# Patient Record
Sex: Male | Born: 1940 | Race: Black or African American | Hispanic: No | Smoking: Former smoker
Health system: Southern US, Community
[De-identification: ages and names within clinical notes are randomized; demographics above are authoritative.]

## PROBLEM LIST (undated history)

## (undated) DIAGNOSIS — F319 Bipolar disorder, unspecified: Secondary | ICD-10-CM

## (undated) DIAGNOSIS — F0390 Unspecified dementia without behavioral disturbance: Secondary | ICD-10-CM

## (undated) DIAGNOSIS — I10 Essential (primary) hypertension: Secondary | ICD-10-CM

## (undated) DIAGNOSIS — N400 Enlarged prostate without lower urinary tract symptoms: Secondary | ICD-10-CM

## (undated) HISTORY — PX: CATARACT EXTRACTION: SUR2

---

## 2014-09-26 ENCOUNTER — Emergency Department (HOSPITAL_COMMUNITY): Payer: Medicare Other

## 2014-09-26 ENCOUNTER — Emergency Department (HOSPITAL_COMMUNITY)
Admission: EM | Admit: 2014-09-26 | Discharge: 2014-09-26 | Disposition: A | Discharge status: Skilled Nursing Facility | Payer: Medicare Other | Attending: Emergency Medicine | Admitting: Emergency Medicine

## 2014-09-26 ENCOUNTER — Encounter (HOSPITAL_COMMUNITY): Payer: Self-pay

## 2014-09-26 DIAGNOSIS — R938 Abnormal findings on diagnostic imaging of other specified body structures: Secondary | ICD-10-CM | POA: Diagnosis not present

## 2014-09-26 DIAGNOSIS — Z79899 Other long term (current) drug therapy: Secondary | ICD-10-CM | POA: Insufficient documentation

## 2014-09-26 DIAGNOSIS — R9389 Abnormal findings on diagnostic imaging of other specified body structures: Secondary | ICD-10-CM

## 2014-09-26 DIAGNOSIS — Z7952 Long term (current) use of systemic steroids: Secondary | ICD-10-CM | POA: Diagnosis not present

## 2014-09-26 DIAGNOSIS — R531 Weakness: Secondary | ICD-10-CM | POA: Insufficient documentation

## 2014-09-26 DIAGNOSIS — Z87891 Personal history of nicotine dependence: Secondary | ICD-10-CM | POA: Insufficient documentation

## 2014-09-26 DIAGNOSIS — F0390 Unspecified dementia without behavioral disturbance: Secondary | ICD-10-CM | POA: Insufficient documentation

## 2014-09-26 DIAGNOSIS — F319 Bipolar disorder, unspecified: Secondary | ICD-10-CM | POA: Diagnosis not present

## 2014-09-26 DIAGNOSIS — N400 Enlarged prostate without lower urinary tract symptoms: Secondary | ICD-10-CM | POA: Insufficient documentation

## 2014-09-26 DIAGNOSIS — Z7982 Long term (current) use of aspirin: Secondary | ICD-10-CM | POA: Diagnosis not present

## 2014-09-26 HISTORY — DX: Bipolar disorder, unspecified: F31.9

## 2014-09-26 HISTORY — DX: Benign prostatic hyperplasia without lower urinary tract symptoms: N40.0

## 2014-09-26 HISTORY — DX: Unspecified dementia, unspecified severity, without behavioral disturbance, psychotic disturbance, mood disturbance, and anxiety: F03.90

## 2014-09-26 LAB — CBC WITH DIFFERENTIAL/PLATELET
Basophils Absolute: 0 10*3/uL (ref 0.0–0.1)
Basophils Relative: 1 % (ref 0–1)
Eosinophils Absolute: 0.1 10*3/uL (ref 0.0–0.7)
Eosinophils Relative: 1 % (ref 0–5)
HCT: 41 % (ref 39.0–52.0)
Hemoglobin: 13.6 g/dL (ref 13.0–17.0)
Lymphocytes Relative: 26 % (ref 12–46)
Lymphs Abs: 1.5 10*3/uL (ref 0.7–4.0)
MCH: 30.4 pg (ref 26.0–34.0)
MCHC: 33.2 g/dL (ref 30.0–36.0)
MCV: 91.7 fL (ref 78.0–100.0)
Monocytes Absolute: 0.3 10*3/uL (ref 0.1–1.0)
Monocytes Relative: 5 % (ref 3–12)
Neutro Abs: 3.9 10*3/uL (ref 1.7–7.7)
Neutrophils Relative %: 67 % (ref 43–77)
Platelets: 231 10*3/uL (ref 150–400)
RBC: 4.47 MIL/uL (ref 4.22–5.81)
RDW: 13.4 % (ref 11.5–15.5)
WBC: 5.8 10*3/uL (ref 4.0–10.5)

## 2014-09-26 LAB — URINALYSIS, ROUTINE W REFLEX MICROSCOPIC
Bilirubin Urine: NEGATIVE
Glucose, UA: NEGATIVE mg/dL
Hgb urine dipstick: NEGATIVE
Ketones, ur: NEGATIVE mg/dL
Leukocytes, UA: NEGATIVE
Nitrite: NEGATIVE
Protein, ur: 30 mg/dL — AB
Specific Gravity, Urine: 1.017 (ref 1.005–1.030)
Urobilinogen, UA: 0.2 mg/dL (ref 0.0–1.0)
pH: 6.5 (ref 5.0–8.0)

## 2014-09-26 LAB — COMPREHENSIVE METABOLIC PANEL
ALT: 16 U/L — ABNORMAL LOW (ref 17–63)
AST: 21 U/L (ref 15–41)
Albumin: 4.1 g/dL (ref 3.5–5.0)
Alkaline Phosphatase: 69 U/L (ref 38–126)
Anion gap: 4 — ABNORMAL LOW (ref 5–15)
BUN: 15 mg/dL (ref 6–20)
CO2: 29 mmol/L (ref 22–32)
Calcium: 9.2 mg/dL (ref 8.9–10.3)
Chloride: 105 mmol/L (ref 101–111)
Creatinine, Ser: 1.3 mg/dL — ABNORMAL HIGH (ref 0.61–1.24)
GFR calc Af Amer: >60 mL/min (ref >60)
GFR calc non Af Amer: 52 mL/min — ABNORMAL LOW (ref >60)
Glucose, Bld: 71 mg/dL (ref 65–99)
Potassium: 4.4 mmol/L (ref 3.5–5.1)
Sodium: 138 mmol/L (ref 135–145)
Total Bilirubin: 0.7 mg/dL (ref 0.3–1.2)
Total Protein: 7.2 g/dL (ref 6.5–8.1)

## 2014-09-26 LAB — URINE MICROSCOPIC-ADD ON

## 2014-09-26 NOTE — Discharge Instructions (Signed)
Weakness Weakness is a lack of strength. It may be felt all over the body (generalized) or in one specific part of the body (focal). Some causes of weakness can be serious. You may need further medical evaluation, especially if you are elderly or you have a history of immunosuppression (such as chemotherapy or HIV), kidney disease, heart disease, or diabetes. CAUSES  Weakness can be caused by many different things, including:  Infection.  Physical exhaustion.  Internal bleeding or other blood loss that results in a lack of red blood cells (anemia).  Dehydration. This cause is more common in elderly people.  Side effects or electrolyte abnormalities from medicines, such as pain medicines or sedatives.  Emotional distress, anxiety, or depression.  Circulation problems, especially severe peripheral arterial disease.  Heart disease, such as rapid atrial fibrillation, bradycardia, or heart failure.  Nervous system disorders, such as Guillain-Barr syndrome, multiple sclerosis, or stroke. DIAGNOSIS  To find the cause of your weakness, your caregiver will take your history and perform a physical exam. Lab tests or X-rays may also be ordered, if needed. TREATMENT  Treatment of weakness depends on the cause of your symptoms and can vary greatly. HOME CARE INSTRUCTIONS   Rest as needed.  Eat a well-balanced diet.  Try to get some exercise every day.  Only take over-the-counter or prescription medicines as directed by your caregiver. SEEK MEDICAL CARE IF:   Your weakness seems to be getting worse or spreads to other parts of your body.  You develop new aches or pains. SEEK IMMEDIATE MEDICAL CARE IF:   You cannot perform your normal daily activities, such as getting dressed and feeding yourself.  You cannot walk up and down stairs, or you feel exhausted when you do so.  You have shortness of breath or chest pain.  You have difficulty moving parts of your body.  You have weakness  in only one area of the body or on only one side of the body.  You have a fever.  You have trouble speaking or swallowing.  You cannot control your bladder or bowel movements.  You have black or bloody vomit or stools. MAKE SURE YOU:  Understand these instructions.  Will watch your condition.  Will get help right away if you are not doing well or get worse. Document Released: 02/03/2005 Document Revised: 08/05/2011 Document Reviewed: 04/04/2011 [facility] Patient Information 2015 [place], [place]. This information is not intended to replace advice given to you by your health care provider. Make sure you discuss any questions you have with your health care provider. Incidental Abnormal Radiological Finding An incidental abnormal radiologic finding is a very small mass or scar tissue, detected anywhere in the body, unrelated to the reason for your visit. With newer imaging tests and technologies, it is becoming more common to detect small masses and tissue abnormalities. It is important for you to work with your caregiver because it can sometimes be related to undiagnosed illness or other symptoms. Most often however, the finding is not causing symptoms and is not a cause for concern. TYPES OF FINDINGS Abnormal radiologic findings are often located in the kidneys or lungs, but they can also be found in the heart, liver, breasts, brain, gallbladder, uterus and other surrounding organs and tissues. There are many types of masses and tissue abnormalities that can be detected during an imaging test. These may include:  Lesions - changes in tissue due to infection, tissue death, or trauma.  Cysts - a sac filled with fluid, crystals,  or some other substance.  Tumors - non-cancerous or cancerous solid formation. You may hear medical terms, such as, "pulmonary nodule" (a small mass in the lung) or "renal mass" (mass in the kidney). Ask your caregiver if these terms apply to your findings.  DO I  NEED FURTHER DIAGNOSIS? There are many possible causes of incidental radiologic findings. Your caregiver will determine whether it requires additional screening tests, diagnostic tests, treatments, or referral to a surgeon.Generally, very small tissue changes or masses will not require any follow up testing. Much research has been done in this area.These very small abnormalities are considered low risk of becoming a problem in the future.  Depending on the size and appearance of the finding, your caregiver may recommend additional testing.Additional testing may also be recommended if you have certain risk factors or medical conditions that increase your risk of related problems. It is a good idea to have additional testing if you have other symptoms or concerns. Sometimes, these early findings can give you a chance at early treatment and avoid problems in the future. TESTING AND DIAGNOSIS Tests and exams may be a one-time screening or periodic follow-up. Periodic follow-up will help your caregiver determine whether the abnormality is growing and becoming a concern. Tests may include:  Physical examination.  Blood tests.  Urine tests.  Imaging tests, such as abdominal ultrasound, CT scan, or MRI.  Biopsy. TREATMENT Treatment varies, depending on the cause, location, size and appearance of the finding. Treatment will also depend on your age and underlying conditions or symptoms. Sometimes treatment is not necessary at all. However, treatment may include:  Watchful waiting with periodic examination and testing.  Treatments to reduce the size of the abnormality.  Surgical or biopsy removal of the abnormality.  Additional treatments to address any underlying conditions. HOME CARE INSTRUCTIONS   See your caregiver for follow up examination and testing as directed. It is important that you schedule appointments as directed.  Keep calm. Your caregiver will let you know if this is a routine  follow up, or if there is a reason for concern. Remember, early detection can be very beneficial to you.  Follow all of your caregiver's aftercare instructions related to the reason for your visit. Document Released: 05/21/2010 Document Revised: 04/28/2011 Document Reviewed: 05/21/2010 [facility] Patient Information 2015 [place], [place]. This information is not intended to replace advice given to you by your health care provider. Make sure you discuss any questions you have with your health care provider.

## 2014-09-26 NOTE — ED Notes (Signed)
Per EMS, Pt, from [facility], c/o weakness x 1 day.  Denies pain.  Staff reported they feel he has "more confused" x a couple days.  The example provided was "the patient came out of his room w/o socks and shoes which is abnormal."  Hx of mild dementia.

## 2014-09-26 NOTE — ED Notes (Signed)
[facility] notified about transport to facility

## 2014-09-26 NOTE — ED Notes (Signed)
Bed: ZO10 Expected date:  Expected time:  Means of arrival:  Comments: EMS- 74yo M, weak and confused x 1 day

## 2014-09-26 NOTE — ED Notes (Signed)
Patient transported to X-ray 

## 2014-09-26 NOTE — ED Provider Notes (Signed)
CSN: [id number]     Arrival date & time 09/26/14  1019 History   First MD Initiated Contact with Patient 09/26/14 1026     Chief Complaint  Patient presents with  . Weakness      HPI Per EMS, Pt, from [facility], c/o weakness x 1 day. Denies pain. Staff reported they feel he has "more confused" x a couple days. The example provided was "the patient came out of his room w/o socks and shoes which is abnormal." Hx of mild dementia.  Past Medical History  Diagnosis Date  . Dementia   . BPH (benign prostatic hypertrophy)   . Bipolar disorder    No past surgical history on file. No family history on file. History  Substance Use Topics  . Smoking status: Former Games developer  . Smokeless tobacco: Not on file  . Alcohol Use: No    Review of Systems  All other systems reviewed and are negative  Allergies  Review of patient's allergies indicates no known allergies.  Home Medications   Prior to Admission medications   Medication Sig Start Date End Date Taking? Authorizing Provider  allopurinol (ZYLOPRIM) 100 MG tablet Take 200 mg by mouth daily.   Yes Historical Provider, MD  amLODipine (NORVASC) 5 MG tablet Take 5 mg by mouth daily.   Yes Historical Provider, MD  aspirin 81 MG tablet Take 81 mg by mouth daily.   Yes Historical Provider, MD  atorvastatin (LIPITOR) 10 MG tablet Take 5 mg by mouth daily.   Yes Historical Provider, MD  carvedilol (COREG) 3.125 MG tablet Take 3.125 mg by mouth 2 (two) times daily with a meal.   Yes Historical Provider, MD  donepezil (ARICEPT) 10 MG tablet Take 10 mg by mouth at bedtime.   Yes Historical Provider, MD  gabapentin (NEURONTIN) 100 MG capsule Take 100 mg by mouth every 12 (twelve) hours.   Yes Historical Provider, MD  lisinopril (PRINIVIL,ZESTRIL) 2.5 MG tablet Take 2.5 mg by mouth daily.   Yes Historical Provider, MD  prednisoLONE acetate (PRED FORTE) 1 % ophthalmic suspension Place 1 drop into the left eye 2 (two) times daily.    Yes Historical Provider, MD  QUEtiapine (SEROQUEL) 25 MG tablet Take 25 mg by mouth at bedtime.   Yes Historical Provider, MD  tamsulosin (FLOMAX) 0.4 MG CAPS capsule Take 0.4 mg by mouth at bedtime.   Yes Historical Provider, MD   BP 131/96 mmHg  Pulse 69  Temp(Src) 97.5 F (36.4 C) (Oral)  Resp 18  SpO2 97% Physical Exam Physical Exam  Nursing note and vitals reviewed. Constitutional: He is oriented to person, place, and time. He appears well-developed and well-nourished. No distress.  HENT:  Head: Normocephalic and atraumatic.  Eyes: Pupils are equal, round, and reactive to light.  Neck: Normal range of motion.  Cardiovascular: Normal rate and intact distal pulses.   Pulmonary/Chest: No respiratory distress.  Abdominal: Normal appearance. He exhibits no distension.  Musculoskeletal: Normal range of motion.  Neurological: He is alert and oriented to person, place.. No cranial nerve deficit.  Skin: Skin is warm and dry. No rash noted.  Psychiatric: He has a normal mood and affect. His behavior is normal.   ED Course  Procedures (including critical care time) Labs Review Labs Reviewed  COMPREHENSIVE METABOLIC PANEL - Abnormal; Notable for the following:    Creatinine, Ser 1.30 (*)    ALT 16 (*)    GFR calc non Af Amer 52 (*)    Anion  gap 4 (*)    All other components within normal limits  URINALYSIS, ROUTINE W REFLEX MICROSCOPIC (NOT AT [facility]) - Abnormal; Notable for the following:    Protein, ur 30 (*)    All other components within normal limits  CBC WITH DIFFERENTIAL/PLATELET  URINE MICROSCOPIC-ADD ON    Imaging Review Dg Chest 2 View  09/26/2014   CLINICAL DATA:  74 year old male with weakness for 1 day and confusion. Initial encounter.  EXAM: CHEST  2 VIEW  COMPARISON:  [facility] chest radiographs 07/09/2014.  FINDINGS: Semi upright AP and lateral views of the chest. Stable lung volumes. No pneumothorax or pulmonary edema. No pleural effusion or  confluent pulmonary opacity. Cardiomegaly and tortuosity of the thoracic aorta appears stable. Visualized tracheal air column is within normal limits.  The visualized osseous structures appear diffusely sclerotic, and more conspicuous than on the comparison. No destructive osseous lesion is identified.  IMPRESSION: 1. Stable cardiomegaly.  No acute cardiopulmonary abnormality. 2. Suggestion of diffuse bony sclerosis such that the possibility of osteoblastic metastatic disease is difficult to exclude. Recommend correlation for hypercalcemia, abnormal PSA.   Electronically Signed   By: Odessa Fleming M.D.   On: 09/26/2014 10:55   Ct Head Wo Contrast  09/26/2014   CLINICAL DATA:  Weakness for 1 day.  No pain.  EXAM: CT HEAD WITHOUT CONTRAST  TECHNIQUE: Contiguous axial images were obtained from the base of the skull through the vertex without intravenous contrast.  COMPARISON:  07/09/2014  FINDINGS: There is no evidence of mass effect, midline shift, or extra-axial fluid collections. There is no evidence of a space-occupying lesion or intracranial hemorrhage. There is no evidence of a cortical-based area of acute infarction. There are stable small focal dural calcifications in the right frontal lobe and left parietal lobe, without aggressive features, likely benign. There is generalized cerebral atrophy. There is periventricular white matter low attenuation likely secondary to microangiopathy.  The ventricles and sulci are appropriate for the patient's age. The basal cisterns are patent.  Visualized portions of the orbits are unremarkable. The visualized portions of the paranasal sinuses and mastoid air cells are unremarkable.  The osseous structures are unremarkable.  IMPRESSION: No acute intracranial pathology.   Electronically Signed   By: Elige Ko   On: 09/26/2014 12:00     Spoke with the nursing home in no recent changes in medication noted.  Normal calcium.  No evidence of metastatic disease in the brain. MDM    Final diagnoses:  Weakness  Abnormal chest x-ray        Nelva Nay, MD 09/26/14 1342

## 2014-11-28 ENCOUNTER — Encounter (HOSPITAL_COMMUNITY): Payer: Self-pay | Admitting: Emergency Medicine

## 2014-11-28 ENCOUNTER — Emergency Department (HOSPITAL_COMMUNITY): Payer: Medicare Other

## 2014-11-28 ENCOUNTER — Emergency Department (HOSPITAL_COMMUNITY)
Admission: EM | Admit: 2014-11-28 | Discharge: 2014-11-28 | Disposition: A | Discharge status: Home / Self Care | Payer: Medicare Other | Attending: Emergency Medicine | Admitting: Emergency Medicine

## 2014-11-28 DIAGNOSIS — F319 Bipolar disorder, unspecified: Secondary | ICD-10-CM | POA: Diagnosis not present

## 2014-11-28 DIAGNOSIS — F0390 Unspecified dementia without behavioral disturbance: Secondary | ICD-10-CM | POA: Insufficient documentation

## 2014-11-28 DIAGNOSIS — S0990XA Unspecified injury of head, initial encounter: Secondary | ICD-10-CM | POA: Diagnosis present

## 2014-11-28 DIAGNOSIS — Z79899 Other long term (current) drug therapy: Secondary | ICD-10-CM | POA: Diagnosis not present

## 2014-11-28 DIAGNOSIS — Y998 Other external cause status: Secondary | ICD-10-CM | POA: Insufficient documentation

## 2014-11-28 DIAGNOSIS — S0191XA Laceration without foreign body of unspecified part of head, initial encounter: Secondary | ICD-10-CM

## 2014-11-28 DIAGNOSIS — Y9389 Activity, other specified: Secondary | ICD-10-CM | POA: Diagnosis not present

## 2014-11-28 DIAGNOSIS — S0181XA Laceration without foreign body of other part of head, initial encounter: Secondary | ICD-10-CM | POA: Diagnosis not present

## 2014-11-28 DIAGNOSIS — Z7982 Long term (current) use of aspirin: Secondary | ICD-10-CM | POA: Insufficient documentation

## 2014-11-28 DIAGNOSIS — Y9289 Other specified places as the place of occurrence of the external cause: Secondary | ICD-10-CM | POA: Insufficient documentation

## 2014-11-28 DIAGNOSIS — N400 Enlarged prostate without lower urinary tract symptoms: Secondary | ICD-10-CM | POA: Insufficient documentation

## 2014-11-28 DIAGNOSIS — Z87891 Personal history of nicotine dependence: Secondary | ICD-10-CM | POA: Insufficient documentation

## 2014-11-28 DIAGNOSIS — W19XXXA Unspecified fall, initial encounter: Secondary | ICD-10-CM | POA: Diagnosis not present

## 2014-11-28 MED ORDER — LIDOCAINE HCL 2 % IJ SOLN
5.0000 mL | Freq: Once | INTRAMUSCULAR | Status: AC
  Administered 2014-11-28: 100 mg via INTRADERMAL
  Filled 2014-11-28: qty 20

## 2014-11-28 NOTE — ED Notes (Signed)
Pt is from [facility] and had an unwitnessed fall. Pt was able to get up by himself but has a laceration on the back of his head. Bleeding controlled. No complaints. C collar in place for precaution.

## 2014-11-28 NOTE — ED Notes (Signed)
Patient was alert, oriented and stable upon discharge. RN went over AVS and patient had no further questions.  

## 2014-11-28 NOTE — Discharge Instructions (Signed)
- Follow up with PCP or urgent care in 7-10 days for staple removal    Stitches, Staples, or Adhesive Wound Closure Health care providers use stitches (sutures), staples, and certain glue (skin adhesives) to hold skin together while it heals (wound closure). You may need this treatment after you have surgery or if you cut your skin accidentally. These methods help your skin to heal more quickly and make it less likely that you will have a scar. A wound may take several months to heal completely. The type of wound you have determines when your wound gets closed. In most cases, the wound is closed as soon as possible (primary skin closure). Sometimes, closure is delayed so the wound can be cleaned and allowed to heal naturally. This reduces the chance of infection. Delayed closure may be needed if your wound:  Is caused by a bite.  Happened more than 6 hours ago.  Involves loss of skin or the tissues under the skin.  Has dirt or debris in it that cannot be removed.  Is infected. WHAT ARE THE DIFFERENT KINDS OF WOUND CLOSURES? There are many options for wound closure. The one that your health care provider uses depends on how deep and how large your wound is. Adhesive Glue To use this type of glue to close a wound, your health care provider holds the edges of the wound together and paints the glue on the surface of your skin. You may need more than one layer of glue. Then the wound may be covered with a light bandage (dressing). This type of skin closure may be used for small wounds that are not deep (superficial). Using glue for wound closure is less painful than other methods. It does not require a medicine that numbs the area (local anesthetic). This method also leaves nothing to be removed. Adhesive glue is often used for children and on facial wounds. Adhesive glue cannot be used for wounds that are deep, uneven, or bleeding. It is not used inside of a wound.  Adhesive Strips These strips  are made of sticky (adhesive), porous paper. They are applied across your skin edges like a regular adhesive bandage. You leave them on until they fall off. Adhesive strips may be used to close very superficial wounds. They may also be used along with sutures to improve the closure of your skin edges.  Sutures Sutures are the oldest method of wound closure. Sutures can be made from natural substances, such as silk, or from synthetic materials, such as nylon and steel. They can be made from a material that your body can break down as your wound heals (absorbable), or they can be made from a material that needs to be removed from your skin (nonabsorbable). They come in many different strengths and sizes. Your health care provider attaches the sutures to a steel needle on one end. Sutures can be passed through your skin, or through the tissues beneath your skin. Then they are tied and cut. Your skin edges may be closed in one continuous stitch or in separate stitches. Sutures are strong and can be used for all kinds of wounds. Absorbable sutures may be used to close tissues under the skin. The disadvantage of sutures is that they may cause skin reactions that lead to infection. Nonabsorbable sutures need to be removed. Staples When surgical staples are used to close a wound, the edges of your skin on both sides of the wound are brought close together. A staple is placed across  the wound, and an instrument secures the edges together. Staples are often used to close surgical cuts (incisions). Staples are faster to use than sutures, and they cause less skin reaction. Staples need to be removed using a tool that bends the staples away from your skin. HOW DO I CARE FOR MY WOUND CLOSURE?  Take medicines only as directed by your health care provider.  If you were prescribed an antibiotic medicine for your wound, finish it all even if you start to feel better.  Use ointments or creams only as directed by your  health care provider.  Wash your hands with soap and water before and after touching your wound.  Do not soak your wound in water. Do not take baths, swim, or use a hot tub until your health care provider approves.  Ask your health care provider when you can start showering. Cover your wound if directed by your health care provider.  Do not take out your own sutures or staples.  Do not pick at your wound. Picking can cause an infection.  Keep all follow-up visits as directed by your health care provider. This is important. HOW LONG WILL I HAVE MY WOUND CLOSURE?  Leave adhesive glue on your skin until the glue peels away.  Leave adhesive strips on your skin until the strips fall off.  Absorbable sutures will dissolve within several days.  Nonabsorbable sutures and staples must be removed. The location of the wound will determine how long they stay in. This can range from several days to a couple of weeks. WHEN SHOULD I SEEK HELP FOR MY WOUND CLOSURE? Contact your health care provider if:  You have a fever.  You have chills.  You have drainage, redness, swelling, or pain at your wound.  There is a bad smell coming from your wound.  The skin edges of your wound start to separate after your sutures have been removed.  Your wound becomes thick, raised, and darker in color after your sutures come out (scarring).   This information is not intended to replace advice given to you by your health care provider. Make sure you discuss any questions you have with your health care provider.   Document Released: 10/29/2000 Document Revised: 02/24/2014 Document Reviewed: 07/13/2013 Elsevier Interactive Patient Education 2016 [employer]  Wound Care Taking care of your wound properly can help to prevent pain and infection. It can also help your wound to heal more quickly.  HOW TO CARE FOR YOUR WOUND  Take or apply over-the-counter and prescription medicines only as told by your health  care provider.  If you were prescribed antibiotic medicine, take or apply it as told by your health care provider. Do not stop using the antibiotic even if your condition improves.  Clean the wound each day or as told by your health care provider.  Wash the wound with mild soap and water.  Rinse the wound with water to remove all soap.  Pat the wound dry with a clean towel. Do not rub it.  There are many different ways to close and cover a wound. For example, a wound can be covered with stitches (sutures), skin glue, or adhesive strips. Follow instructions from your health care provider about:  How to take care of your wound.  When and how you should change your bandage (dressing).  When you should remove your dressing.  Removing whatever was used to close your wound.  Check your wound every day for signs of infection. Watch for:  Redness, swelling, or pain.  Fluid, blood, or pus.  Keep the dressing dry until your health care provider says it can be removed. Do not take baths, swim, use a hot tub, or do anything that would put your wound underwater until your health care provider approves.  Raise (elevate) the injured area above the level of your heart while you are sitting or lying down.  Do not scratch or pick at the wound.  Keep all follow-up visits as told by your health care provider. This is important. SEEK MEDICAL CARE IF:  You received a tetanus shot and you have swelling, severe pain, redness, or bleeding at the injection site.  You have a fever.  Your pain is not controlled with medicine.  You have increased redness, swelling, or pain at the site of your wound.  You have fluid, blood, or pus coming from your wound.  You notice a bad smell coming from your wound or your dressing. SEEK IMMEDIATE MEDICAL CARE IF:  You have a red streak going away from your wound.   This information is not intended to replace advice given to you by your health care provider.  Make sure you discuss any questions you have with your health care provider.   Document Released: 11/13/2007 Document Revised: 06/20/2014 Document Reviewed: 01/30/2014 Elsevier Interactive Patient Education [employer]

## 2014-11-28 NOTE — ED Provider Notes (Signed)
CSN: [id number]     Arrival date & time 11/28/14  1928 History   First MD Initiated Contact with Patient 11/28/14 1935     Chief Complaint  Patient presents with  . Fall  . Head Injury   Level 5 caveat - dementia  HPI  Roberto Simmons is a 74 year old male presenting after a fall. Pt's son reports that he was at his assisted living facility when he fell while trying to reach and open his blinds. Pt was able to get up on his own and ambulate immediately following the fall. The staff at his nursing home noted a laceration to the right posterior occiput and sent him to ED for evaluation. Pt currently denies pain. Pt not conversational and only replies with yes or no. Pt's son is at bedside and states that he is at baseline.    Past Medical History  Diagnosis Date  . Dementia   . BPH (benign prostatic hypertrophy)   . Bipolar disorder (HCC)    History reviewed. No pertinent past surgical history. History reviewed. No pertinent family history. Social History  Substance Use Topics  . Smoking status: Former Games developer  . Smokeless tobacco: None  . Alcohol Use: No    Review of Systems  Unable to perform ROS: Dementia      Allergies  Review of patient's allergies indicates no known allergies.  Home Medications   Prior to Admission medications   Medication Sig Start Date End Date Taking? Authorizing Provider  allopurinol (ZYLOPRIM) 100 MG tablet Take 200 mg by mouth daily.   Yes Historical Provider, MD  amLODipine (NORVASC) 5 MG tablet Take 5 mg by mouth daily.   Yes Historical Provider, MD  aspirin 81 MG tablet Take 81 mg by mouth daily.   Yes Historical Provider, MD  atorvastatin (LIPITOR) 10 MG tablet Take 5 mg by mouth daily.   Yes Historical Provider, MD  carvedilol (COREG) 3.125 MG tablet Take 3.125 mg by mouth 2 (two) times daily with a meal.   Yes Historical Provider, MD  donepezil (ARICEPT) 5 MG tablet Take 5 mg by mouth at bedtime.   Yes Historical Provider, MD  finasteride  (PROSCAR) 5 MG tablet Take 5 mg by mouth at bedtime.   Yes Historical Provider, MD  gabapentin (NEURONTIN) 100 MG capsule Take 100 mg by mouth at bedtime.    Yes Historical Provider, MD  lisinopril (PRINIVIL,ZESTRIL) 2.5 MG tablet Take 2.5 mg by mouth daily.   Yes Historical Provider, MD  prednisoLONE acetate (PRED FORTE) 1 % ophthalmic suspension Place 1 drop into the left eye 3 (three) times daily.    Yes Historical Provider, MD  QUEtiapine (SEROQUEL) 25 MG tablet Take 25 mg by mouth at bedtime.   Yes Historical Provider, MD  tamsulosin (FLOMAX) 0.4 MG CAPS capsule Take 0.4 mg by mouth at bedtime.   Yes Historical Provider, MD   BP 146/83 mmHg  Pulse 61  Temp(Src) 98.1 F (36.7 C) (Oral)  Resp 16  SpO2 98% Physical Exam  Constitutional: He appears well-developed and well-nourished. No distress.  HENT:  Head: Normocephalic and atraumatic.  3 cm laceration to right posterior occiput. Bleeding controlled  Eyes: Conjunctivae are normal. Pupils are equal, round, and reactive to light. Right eye exhibits no discharge. Left eye exhibits no discharge. No scleral icterus.  Neck: Normal range of motion. Neck supple.  No neck tenderness. No bony deformity or step offs  Cardiovascular: Normal rate, regular rhythm and normal heart sounds.  Pulmonary/Chest: Effort normal and breath sounds normal. No respiratory distress. He has no wheezes. He has no rales.  Abdominal: Soft. There is no tenderness. There is no rebound and no guarding.  Musculoskeletal: Normal range of motion.  Moves all extremities spontaneously and without obvious pain or deformity  Neurological: He is alert. Coordination normal.  Oriented to person and place. Pt would not follow exams for cranial nerve testing. 5/5 grip strength b/l.   Skin: Skin is warm and dry.  Psychiatric: He has a normal mood and affect. His behavior is normal.  Nursing note and vitals reviewed.   ED Course  Procedures (including critical care  time)  LACERATION REPAIR Performed by: Simeon Craft Authorized by: Simeon Craft Consent: Verbal consent obtained. Risks and benefits: risks, benefits and alternatives were discussed Consent given by: patient Patient identity confirmed: provided demographic data Prepped and Draped in normal sterile fashion Wound explored  Laceration Location: right posterior occiput  Laceration Length: 3 cm  No Foreign Bodies seen or palpated  Anesthesia: local infiltration  Local anesthetic: lidocaine 2% without epinephrine  Anesthetic total: 5 ml  Irrigation method: syringe Amount of cleaning: standard  Skin closure: staple  Number of sutures: 4  Technique: staple  Patient tolerance: Patient tolerated the procedure well with no immediate complications. Labs Review Labs Reviewed - No data to display  Imaging Review Ct Head Wo Contrast  11/28/2014  CLINICAL DATA:  Unwitnessed fall, posterior scalp laceration with neck pain EXAM: CT HEAD WITHOUT CONTRAST CT CERVICAL SPINE WITHOUT CONTRAST TECHNIQUE: Multidetector CT imaging of the head and cervical spine was performed following the standard protocol without intravenous contrast. Multiplanar CT image reconstructions of the cervical spine were also generated. COMPARISON:  09/26/2014 FINDINGS: CT HEAD FINDINGS Bony calvarium is intact. A right posterior parietal skin lacerations noted consistent with the given clinical history. Mild atrophic changes are noted commenced with the patient's given age. Chronic white matter ischemic changes noted as well. No findings to suggest acute hemorrhage, acute infarction or space-occupying mass lesion are noted. CT CERVICAL SPINE FINDINGS Seven cervical segments are well visualized. Vertebral body height is well maintained. Osteophytic changes are noted throughout the cervical spine. Facet hypertrophic changes are noted. No acute fracture is seen. The surrounding soft tissue structures are within normal  limits. IMPRESSION: CT of the head: Chronic atrophic and ischemic changes without acute abnormality. CT of the cervical spine: Multilevel degenerative changes without acute abnormality. Electronically Signed   By: Alcide Clever M.D.   On: 11/28/2014 21:24   Ct Cervical Spine Wo Contrast  11/28/2014  CLINICAL DATA:  Unwitnessed fall, posterior scalp laceration with neck pain EXAM: CT HEAD WITHOUT CONTRAST CT CERVICAL SPINE WITHOUT CONTRAST TECHNIQUE: Multidetector CT imaging of the head and cervical spine was performed following the standard protocol without intravenous contrast. Multiplanar CT image reconstructions of the cervical spine were also generated. COMPARISON:  09/26/2014 FINDINGS: CT HEAD FINDINGS Bony calvarium is intact. A right posterior parietal skin lacerations noted consistent with the given clinical history. Mild atrophic changes are noted commenced with the patient's given age. Chronic white matter ischemic changes noted as well. No findings to suggest acute hemorrhage, acute infarction or space-occupying mass lesion are noted. CT CERVICAL SPINE FINDINGS Seven cervical segments are well visualized. Vertebral body height is well maintained. Osteophytic changes are noted throughout the cervical spine. Facet hypertrophic changes are noted. No acute fracture is seen. The surrounding soft tissue structures are within normal limits. IMPRESSION: CT of the head: Chronic  atrophic and ischemic changes without acute abnormality. CT of the cervical spine: Multilevel degenerative changes without acute abnormality. Electronically Signed   By: Alcide Clever M.D.   On: 11/28/2014 21:24   I have personally reviewed and evaluated these images and lab results as part of my medical decision-making.   EKG Interpretation None      MDM   Final diagnoses:  Fall  Laceration of head, initial encounter   Pt presenting after a mechanical fall at his nursing facility. Pt denies current pain. He has a small  laceration to the posterior occiput with bleeding under control at this time. VSS. Pt difficult to interview due to dementia. PERRL. Moves all extremities spontaneously and without obvious deformity. 5/5 grip strength. Head and c spine CT without acute deformity. Laceration repaired with staples. Return precautions given in discharge paperwork and discussed with pt at bedside. Pt stable for discharge back to nursing facility      Alveta Heimlich, PA-C 11/29/14 1231  Azalia Bilis, MD 12/01/14 1459

## 2014-11-28 NOTE — Progress Notes (Signed)
Patient listed as being a resident at [facility].  Patient's son at bedside reports patient's pcp is Dr. Florentina Jenny.  System updated.

## 2014-11-28 NOTE — ED Notes (Signed)
Patient transported to CT 

## 2014-11-28 NOTE — ED Notes (Signed)
Bed: [facility] Expected date:  Expected time:  Means of arrival:  Comments: No bed 

## 2015-01-29 ENCOUNTER — Encounter (HOSPITAL_COMMUNITY): Payer: Self-pay | Admitting: Emergency Medicine

## 2015-01-29 ENCOUNTER — Emergency Department (HOSPITAL_COMMUNITY)
Admission: EM | Admit: 2015-01-29 | Discharge: 2015-01-29 | Disposition: A | Discharge status: Home / Self Care | Payer: Medicare Other | Attending: Emergency Medicine | Admitting: Emergency Medicine

## 2015-01-29 ENCOUNTER — Emergency Department (HOSPITAL_COMMUNITY): Payer: Medicare Other

## 2015-01-29 DIAGNOSIS — F0390 Unspecified dementia without behavioral disturbance: Secondary | ICD-10-CM | POA: Diagnosis not present

## 2015-01-29 DIAGNOSIS — W01198A Fall on same level from slipping, tripping and stumbling with subsequent striking against other object, initial encounter: Secondary | ICD-10-CM | POA: Insufficient documentation

## 2015-01-29 DIAGNOSIS — Z7982 Long term (current) use of aspirin: Secondary | ICD-10-CM | POA: Diagnosis not present

## 2015-01-29 DIAGNOSIS — F319 Bipolar disorder, unspecified: Secondary | ICD-10-CM | POA: Diagnosis not present

## 2015-01-29 DIAGNOSIS — Z043 Encounter for examination and observation following other accident: Secondary | ICD-10-CM | POA: Diagnosis present

## 2015-01-29 DIAGNOSIS — Y92121 Bathroom in nursing home as the place of occurrence of the external cause: Secondary | ICD-10-CM | POA: Diagnosis not present

## 2015-01-29 DIAGNOSIS — N400 Enlarged prostate without lower urinary tract symptoms: Secondary | ICD-10-CM | POA: Insufficient documentation

## 2015-01-29 DIAGNOSIS — Z87891 Personal history of nicotine dependence: Secondary | ICD-10-CM | POA: Diagnosis not present

## 2015-01-29 DIAGNOSIS — Y9389 Activity, other specified: Secondary | ICD-10-CM | POA: Diagnosis not present

## 2015-01-29 DIAGNOSIS — W19XXXA Unspecified fall, initial encounter: Secondary | ICD-10-CM

## 2015-01-29 DIAGNOSIS — Y998 Other external cause status: Secondary | ICD-10-CM | POA: Insufficient documentation

## 2015-01-29 DIAGNOSIS — Z79899 Other long term (current) drug therapy: Secondary | ICD-10-CM | POA: Insufficient documentation

## 2015-01-29 LAB — BASIC METABOLIC PANEL
ANION GAP: 8 (ref 5–15)
BUN: 18 mg/dL (ref 6–20)
CHLORIDE: 105 mmol/L (ref 101–111)
CO2: 26 mmol/L (ref 22–32)
Calcium: 9.1 mg/dL (ref 8.9–10.3)
Creatinine, Ser: 1.24 mg/dL (ref 0.61–1.24)
GFR calc Af Amer: >60 mL/min (ref >60)
GFR, EST NON AFRICAN AMERICAN: 56 mL/min — AB (ref >60)
GLUCOSE: 82 mg/dL (ref 65–99)
POTASSIUM: 4.2 mmol/L (ref 3.5–5.1)
Sodium: 139 mmol/L (ref 135–145)

## 2015-01-29 LAB — CBC WITH DIFFERENTIAL/PLATELET
BASOS ABS: 0 10*3/uL (ref 0.0–0.1)
Basophils Relative: 1 %
EOS PCT: 1 %
Eosinophils Absolute: 0.1 10*3/uL (ref 0.0–0.7)
HCT: 40.8 % (ref 39.0–52.0)
Hemoglobin: 13.8 g/dL (ref 13.0–17.0)
LYMPHS ABS: 1.8 10*3/uL (ref 0.7–4.0)
Lymphocytes Relative: 26 %
MCH: 31.6 pg (ref 26.0–34.0)
MCHC: 33.8 g/dL (ref 30.0–36.0)
MCV: 93.4 fL (ref 78.0–100.0)
Monocytes Absolute: 0.5 10*3/uL (ref 0.1–1.0)
Monocytes Relative: 8 %
NEUTROS ABS: 4.5 10*3/uL (ref 1.7–7.7)
NEUTROS PCT: 64 %
PLATELETS: 244 10*3/uL (ref 150–400)
RBC: 4.37 MIL/uL (ref 4.22–5.81)
RDW: 12.7 % (ref 11.5–15.5)
WBC: 7 10*3/uL (ref 4.0–10.5)

## 2015-01-29 LAB — I-STAT TROPONIN, ED: TROPONIN I, POC: 0.01 ng/mL (ref 0.00–0.08)

## 2015-01-29 NOTE — ED Notes (Signed)
Awake. Verbally responsive. A/O x4. Resp even and unlabored. No audible adventitious breath sounds noted. ABC's intact. SR on monitor. 

## 2015-01-29 NOTE — ED Provider Notes (Signed)
CSN: [id number]     Arrival date & time 01/29/15  0932 History   First MD Initiated Contact with Patient 01/29/15 0945     Chief Complaint  Patient presents with  . Fall  . Head Injury     (Consider location/radiation/quality/duration/timing/severity/associated sxs/prior Treatment) Patient is a 74 y.o. male presenting with fall and head injury. The history is provided by the patient.  Fall This is a new problem. The current episode started less than 1 hour ago. The problem occurs constantly. The problem has not changed since onset.Pertinent negatives include no chest pain, no abdominal pain, no headaches and no shortness of breath. Nothing aggravates the symptoms. Nothing relieves the symptoms. He has tried nothing for the symptoms. The treatment provided no relief.  Head Injury Associated symptoms: no headaches and no vomiting     74 yo M with a chief complaint of a fall. Patient states that he took the wrong medicine potentially this morning that made him feel weak he lost his balance and fell. Unsure if he had loss of consciousness. Patient denies any chest pain abdominal pain back pain neck pain. Patient having no complaints currently. Denies any chest pain or headache prior to his fall. Denies blood thinning medications.  Past Medical History  Diagnosis Date  . Dementia   . BPH (benign prostatic hypertrophy)   . Bipolar disorder (HCC)    History reviewed. No pertinent past surgical history. Family History  Problem Relation Age of Onset  . Family history unknown: Yes   Social History  Substance Use Topics  . Smoking status: Former Games developermoker  . Smokeless tobacco: None  . Alcohol Use: No    Review of Systems  Constitutional: Negative for fever and chills.  HENT: Negative for congestion and facial swelling.   Eyes: Negative for discharge and visual disturbance.  Respiratory: Negative for shortness of breath.   Cardiovascular: Negative for chest pain and palpitations.   Gastrointestinal: Negative for vomiting, abdominal pain and diarrhea.  Musculoskeletal: Negative for myalgias and arthralgias.  Skin: Negative for color change and rash.  Neurological: Negative for tremors, syncope and headaches.  Psychiatric/Behavioral: Negative for confusion and dysphoric mood.      Allergies  Review of patient's allergies indicates no known allergies.  Home Medications   Prior to Admission medications   Medication Sig Start Date End Date Taking? Authorizing Provider  allopurinol (ZYLOPRIM) 100 MG tablet Take 200 mg by mouth daily.   Yes Historical Provider, MD  amLODipine (NORVASC) 10 MG tablet Take 10 mg by mouth daily.   Yes Historical Provider, MD  aspirin 81 MG tablet Take 81 mg by mouth daily.   Yes Historical Provider, MD  atorvastatin (LIPITOR) 10 MG tablet Take 5 mg by mouth daily.   Yes Historical Provider, MD  carvedilol (COREG) 3.125 MG tablet Take 3.125 mg by mouth 2 (two) times daily with a meal.   Yes Historical Provider, MD  finasteride (PROSCAR) 5 MG tablet Take 5 mg by mouth at bedtime.   Yes Historical Provider, MD  LORazepam (ATIVAN) 0.5 MG tablet Take 0.25 mg by mouth daily as needed for anxiety.   Yes Historical Provider, MD  metoprolol tartrate (LOPRESSOR) 25 MG tablet Take 12.5 mg by mouth daily.   Yes Historical Provider, MD  tamsulosin (FLOMAX) 0.4 MG CAPS capsule Take 0.8 mg by mouth at bedtime.    Yes Historical Provider, MD   BP 140/82 mmHg  Pulse 81  Temp(Src) 98.5 F (36.9 C) (Oral)  Resp 16  SpO2 98% Physical Exam  Constitutional: He is oriented to person, place, and time. He appears well-developed and well-nourished.  HENT:  Head: Normocephalic and atraumatic.  Eyes: EOM are normal. Pupils are equal, round, and reactive to light.  Neck: Normal range of motion. Neck supple. No JVD present.  Cardiovascular: Normal rate and regular rhythm.  Exam reveals no gallop and no friction rub.   No murmur heard. Pulmonary/Chest: No  respiratory distress. He has no wheezes.  Abdominal: He exhibits no distension. There is no rebound and no guarding.  Musculoskeletal: Normal range of motion.  Neurological: He is alert and oriented to person, place, and time.  Skin: No rash noted. No pallor.  Psychiatric: He has a normal mood and affect. His behavior is normal.  Nursing note and vitals reviewed.   ED Course  Procedures (including critical care time) Labs Review Labs Reviewed  BASIC METABOLIC PANEL - Abnormal; Notable for the following:    GFR calc non Af Amer 56 (*)    All other components within normal limits  CBC WITH DIFFERENTIAL/PLATELET  Rosezena Sensor, ED    Imaging Review Ct Head Wo Contrast  01/29/2015  CLINICAL DATA:  Fall. EXAM: CT HEAD WITHOUT CONTRAST CT CERVICAL SPINE WITHOUT CONTRAST TECHNIQUE: Multidetector CT imaging of the head and cervical spine was performed following the standard protocol without intravenous contrast. Multiplanar CT image reconstructions of the cervical spine were also generated. COMPARISON:  CT 11/28/2014 FINDINGS: CT HEAD FINDINGS Moderate atrophy, stable. Mild chronic microvascular ischemic change in the white matter. Negative for acute infarct.  Negative for acute hemorrhage or mass Negative for skull fracture. 5 x 8 mm dural calcification right frontal lobe is unchanged from the prior study and has a benign appearance. CT CERVICAL SPINE FINDINGS Cervical kyphosis. Multilevel disc degeneration and spondylosis. 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 Multiple levels of spinal stenosis due to disc disease and facet hypertrophy. Central disc protrusion at C3-4. Normal alignment.  Negative for fracture. IMPRESSION: Atrophy and chronic microvascular ischemia. No acute intracranial abnormality Moderate to advanced cervical degenerative change. Negative for fracture. Electronically Signed   By: Marlan Palau M.D.   On: 01/29/2015 10:56   Ct Cervical Spine Wo  Contrast  01/29/2015  CLINICAL DATA:  Fall. EXAM: CT HEAD WITHOUT CONTRAST CT CERVICAL SPINE WITHOUT CONTRAST TECHNIQUE: Multidetector CT imaging of the head and cervical spine was performed following the standard protocol without intravenous contrast. Multiplanar CT image reconstructions of the cervical spine were also generated. COMPARISON:  CT 11/28/2014 FINDINGS: CT HEAD FINDINGS Moderate atrophy, stable. Mild chronic microvascular ischemic change in the white matter. Negative for acute infarct.  Negative for acute hemorrhage or mass Negative for skull fracture. 5 x 8 mm dural calcification right frontal lobe is unchanged from the prior study and has a benign appearance. CT CERVICAL SPINE FINDINGS Cervical kyphosis. Multilevel disc degeneration and spondylosis. 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 Multiple levels of spinal stenosis due to disc disease and facet hypertrophy. Central disc protrusion at C3-4. Normal alignment.  Negative for fracture. IMPRESSION: Atrophy and chronic microvascular ischemia. No acute intracranial abnormality Moderate to advanced cervical degenerative change. Negative for fracture. Electronically Signed   By: Marlan Palau M.D.   On: 01/29/2015 10:56   I have personally reviewed and evaluated these images and lab results as part of my medical decision-making.   EKG Interpretation   Date/Time:  Monday January 29 2015 10:29:42 EST Ventricular Rate:  78 PR Interval:  152 QRS Duration:  98 QT Interval:  379 QTC Calculation: 432 R Axis:   -42 Text Interpretation:  Sinus rhythm Borderline T abnormalities, inferior  leads trace st elevation in I, aVL with reciprocal change in lead III No  old tracing to compare Confirmed by Kamdyn Covel MD, DANIEL [phone]) on 01/29/2015  10:39:13 AM      MDM   Final diagnoses:  Fall, initial encounter    74 yo M with a chief complaint of a fall. Feels like he lost his balance after taking the wrong medication.  Patient takes maybe he took his pain medicine by accident. States he fell and hit the back of his head. Not having any complaints currently.   EKG and laboratory evaluation ordered secondary to unable to know if this was a syncopal event or not. EKG with less than 1 mm elevation in leads 1 and aVL. Patient does have some concordant changes in lead 3. Patient denies any chest pain or shortness of breath. CT head CT C-spine CBC BMP troponin.  Laboratory evaluation unremarkable. Patient is able to ambulate without difficulty. Continues to deny chest pain, shortness of breath. Requesting discharge home. Suggested follow-up with his doctor in 1-2 days.  4:10 PM:  I have discussed the diagnosis/risks/treatment options with the patient and believe the pt to be eligible for discharge home to follow-up with PCP. We also discussed returning to the ED immediately if new or worsening sx occur. We discussed the sx which are most concerning (e.g., sudden worsening cp, sob, fever, inability to tolerate by mouth) that necessitate immediate return. Medications administered to the patient during their visit and any new prescriptions provided to the patient are listed below.  Medications given during this visit Medications - No data to display  Discharge Medication List as of 01/29/2015 12:58 PM      The patient appears reasonably screen and/or stabilized for discharge and I doubt any other medical condition or other [facility] requiring further screening, evaluation, or treatment in the ED at this time prior to discharge.    Melene Plan, DO 01/29/15 1610

## 2015-01-29 NOTE — ED Notes (Signed)
[facility] called to arrange for transport back to facility.

## 2015-01-29 NOTE — ED Notes (Signed)
Per EMS- Patient is a resident [facility]. Patient states he lost his balance while in the bathroom this AM while trying to turn the light on. Patient stated he fell on the floor and hit his head. Staff reported when they arrived the patient was on the floor. Patient denies LOC. MAE. aAlert and oriented, but has a hisory of dementia. No other injuries noted.

## 2015-01-29 NOTE — ED Notes (Signed)
Dr. Floyd at bedside. 

## 2015-01-29 NOTE — ED Notes (Signed)
Bed: [id number] Expected date:  Expected time:  Means of arrival:  Comments: EMS- 70s, fall/head injury/no thinners

## 2015-01-29 NOTE — ED Notes (Signed)
Patient walked in room with assistance of a walker.

## 2015-01-29 NOTE — ED Notes (Signed)
Awake. Verbally responsive. A/O x4. Resp even and unlabored. No audible adventitious breath sounds noted. ABC's intact.  

## 2015-03-14 ENCOUNTER — Emergency Department (HOSPITAL_COMMUNITY)
Admission: EM | Admit: 2015-03-14 | Discharge: 2015-03-15 | Disposition: A | Discharge status: Home / Self Care | Payer: Medicare Other | Attending: Emergency Medicine | Admitting: Emergency Medicine

## 2015-03-14 ENCOUNTER — Emergency Department (HOSPITAL_COMMUNITY): Payer: Medicare Other

## 2015-03-14 ENCOUNTER — Encounter (HOSPITAL_COMMUNITY): Payer: Self-pay

## 2015-03-14 DIAGNOSIS — Z7982 Long term (current) use of aspirin: Secondary | ICD-10-CM | POA: Insufficient documentation

## 2015-03-14 DIAGNOSIS — Z79899 Other long term (current) drug therapy: Secondary | ICD-10-CM | POA: Diagnosis not present

## 2015-03-14 DIAGNOSIS — S0181XA Laceration without foreign body of other part of head, initial encounter: Secondary | ICD-10-CM | POA: Diagnosis not present

## 2015-03-14 DIAGNOSIS — F0390 Unspecified dementia without behavioral disturbance: Secondary | ICD-10-CM | POA: Insufficient documentation

## 2015-03-14 DIAGNOSIS — Y92129 Unspecified place in nursing home as the place of occurrence of the external cause: Secondary | ICD-10-CM | POA: Diagnosis not present

## 2015-03-14 DIAGNOSIS — W01190A Fall on same level from slipping, tripping and stumbling with subsequent striking against furniture, initial encounter: Secondary | ICD-10-CM | POA: Diagnosis not present

## 2015-03-14 DIAGNOSIS — F319 Bipolar disorder, unspecified: Secondary | ICD-10-CM | POA: Insufficient documentation

## 2015-03-14 DIAGNOSIS — Y9389 Activity, other specified: Secondary | ICD-10-CM | POA: Diagnosis not present

## 2015-03-14 DIAGNOSIS — N400 Enlarged prostate without lower urinary tract symptoms: Secondary | ICD-10-CM | POA: Diagnosis not present

## 2015-03-14 DIAGNOSIS — Z87891 Personal history of nicotine dependence: Secondary | ICD-10-CM | POA: Diagnosis not present

## 2015-03-14 DIAGNOSIS — Y999 Unspecified external cause status: Secondary | ICD-10-CM | POA: Diagnosis not present

## 2015-03-14 DIAGNOSIS — S0990XA Unspecified injury of head, initial encounter: Secondary | ICD-10-CM | POA: Diagnosis present

## 2015-03-14 LAB — BASIC METABOLIC PANEL
Anion gap: 12 (ref 5–15)
BUN: 17 mg/dL (ref 6–20)
CHLORIDE: 101 mmol/L (ref 101–111)
CO2: 24 mmol/L (ref 22–32)
CREATININE: 1.36 mg/dL — AB (ref 0.61–1.24)
Calcium: 9.5 mg/dL (ref 8.9–10.3)
GFR, EST AFRICAN AMERICAN: 57 mL/min — AB (ref >60)
GFR, EST NON AFRICAN AMERICAN: 49 mL/min — AB (ref >60)
Glucose, Bld: 100 mg/dL — ABNORMAL HIGH (ref 65–99)
POTASSIUM: 4.1 mmol/L (ref 3.5–5.1)
SODIUM: 137 mmol/L (ref 135–145)

## 2015-03-14 LAB — CBC WITH DIFFERENTIAL/PLATELET
BASOS PCT: 0 %
Basophils Absolute: 0 10*3/uL (ref 0.0–0.1)
EOS ABS: 0.1 10*3/uL (ref 0.0–0.7)
EOS PCT: 2 %
HCT: 40.1 % (ref 39.0–52.0)
HEMOGLOBIN: 13.6 g/dL (ref 13.0–17.0)
Lymphocytes Relative: 27 %
Lymphs Abs: 1.9 10*3/uL (ref 0.7–4.0)
MCH: 31.4 pg (ref 26.0–34.0)
MCHC: 33.9 g/dL (ref 30.0–36.0)
MCV: 92.6 fL (ref 78.0–100.0)
Monocytes Absolute: 0.4 10*3/uL (ref 0.1–1.0)
Monocytes Relative: 6 %
NEUTROS PCT: 65 %
Neutro Abs: 4.7 10*3/uL (ref 1.7–7.7)
PLATELETS: 268 10*3/uL (ref 150–400)
RBC: 4.33 MIL/uL (ref 4.22–5.81)
RDW: 12.8 % (ref 11.5–15.5)
WBC: 7.1 10*3/uL (ref 4.0–10.5)

## 2015-03-14 MED ORDER — TETANUS-DIPHTH-ACELL PERTUSSIS 5-2.5-18.5 LF-MCG/0.5 IM SUSP
0.5000 mL | Freq: Once | INTRAMUSCULAR | Status: AC
  Administered 2015-03-14: 0.5 mL via INTRAMUSCULAR
  Filled 2015-03-14: qty 0.5

## 2015-03-14 NOTE — ED Notes (Signed)
Pt transported to CT scan.

## 2015-03-14 NOTE — ED Notes (Signed)
Pharmacy tech in room states pt is bleeding through bandage on head. This RN back in to room and blood is dripping from bandage. Other RN in to assist. Bandage removed and pressure applied. Pt has large laceration to middle of forehead. Soaking through multiple gauze.

## 2015-03-14 NOTE — Discharge Instructions (Signed)
Facial Laceration ° A facial laceration is a cut on the face. These injuries can be painful and cause bleeding. Lacerations usually heal quickly, but they need special care to reduce scarring. °DIAGNOSIS  °Your health care provider will take a medical history, ask for details about how the injury occurred, and examine the wound to determine how deep the cut is. °TREATMENT  °Some facial lacerations may not require closure. Others may not be able to be closed because of an increased risk of infection. The risk of infection and the chance for successful closure will depend on various factors, including the amount of time since the injury occurred. °The wound may be cleaned to help prevent infection. If closure is appropriate, pain medicines may be given if needed. Your health care provider will use stitches (sutures), wound glue (adhesive), or skin adhesive strips to repair the laceration. These tools bring the skin edges together to allow for faster healing and a better cosmetic outcome. If needed, you may also be given a tetanus shot. °HOME CARE INSTRUCTIONS °· Only take over-the-counter or prescription medicines as directed by your health care provider. °· Follow your health care provider's instructions for wound care. These instructions will vary depending on the technique used for closing the wound. °For Sutures: °· Keep the wound clean and dry.   °· If you were given a bandage (dressing), you should change it at least once a day. Also change the dressing if it becomes wet or dirty, or as directed by your health care provider.   °· Wash the wound with soap and water 2 times a day. Rinse the wound off with water to remove all soap. Pat the wound dry with a clean towel.   °· After cleaning, apply a thin layer of the antibiotic ointment recommended by your health care provider. This will help prevent infection and keep the dressing from sticking.   °· You may shower as usual after the first 24 hours. Do not soak the  wound in water until the sutures are removed.   °· Get your sutures removed as directed by your health care provider. With facial lacerations, sutures should usually be taken out after 4-5 days to avoid stitch marks.   °· Wait a few days after your sutures are removed before applying any makeup. °For Skin Adhesive Strips: °· Keep the wound clean and dry.   °· Do not get the skin adhesive strips wet. You may bathe carefully, using caution to keep the wound dry.   °· If the wound gets wet, pat it dry with a clean towel.   °· Skin adhesive strips will fall off on their own. You may trim the strips as the wound heals. Do not remove skin adhesive strips that are still stuck to the wound. They will fall off in time.   °For Wound Adhesive: °· You may briefly wet your wound in the shower or bath. Do not soak or scrub the wound. Do not swim. Avoid periods of heavy sweating until the skin adhesive has fallen off on its own. After showering or bathing, gently pat the wound dry with a clean towel.   °· Do not apply liquid medicine, cream medicine, ointment medicine, or makeup to your wound while the skin adhesive is in place. This may loosen the film before your wound is healed.   °· If a dressing is placed over the wound, be careful not to apply tape directly over the skin adhesive. This may cause the adhesive to be pulled off before the wound is healed.   °· Avoid   prolonged exposure to sunlight or tanning lamps while the skin adhesive is in place. °· The skin adhesive will usually remain in place for 5-10 days, then naturally fall off the skin. Do not pick at the adhesive film.   °After Healing: °Once the wound has healed, cover the wound with sunscreen during the day for 1 full year. This can help minimize scarring. Exposure to ultraviolet light in the first year will darken the scar. It can take 1-2 years for the scar to lose its redness and to heal completely.  °SEEK MEDICAL CARE IF: °· You have a fever. °SEEK IMMEDIATE  MEDICAL CARE IF: °· You have redness, pain, or swelling around the wound.   °· You see a yellowish-white fluid (pus) coming from the wound.   °  °This information is not intended to replace advice given to you by your health care provider. Make sure you discuss any questions you have with your health care provider. °  °Document Released: 03/13/2004 Document Revised: 02/24/2014 Document Reviewed: 09/16/2012 °Elsevier Interactive Patient Education ©2016 [employer] ° °Head Injury, Adult °You have a head injury. Headaches and throwing up (vomiting) are common after a head injury. It should be easy to wake up from sleeping. Sometimes you must stay in the hospital. Most problems happen within the first 24 hours. Side effects may occur up to 7-10 days after the injury.  °WHAT ARE THE TYPES OF HEAD INJURIES? °Head injuries can be as minor as a bump. Some head injuries can be more severe. More severe head injuries include: °· A jarring injury to the brain (concussion). °· A bruise of the brain (contusion). This mean there is bleeding in the brain that can cause swelling. °· A cracked skull (skull fracture). °· Bleeding in the brain that collects, clots, and forms a bump (hematoma). °WHEN SHOULD I GET HELP RIGHT AWAY?  °· You are confused or sleepy. °· You cannot be woken up. °· You feel sick to your stomach (nauseous) or keep throwing up (vomiting). °· Your dizziness or unsteadiness is getting worse. °· You have very bad, lasting headaches that are not helped by medicine. Take medicines only as told by your doctor. °· You cannot use your arms or legs like normal. °· You cannot walk. °· You notice changes in the black spots in the center of the colored part of your eye (pupil). °· You have clear or bloody fluid coming from your nose or ears. °· You have trouble seeing. °During the next 24 hours after the injury, you must stay with someone who can watch you. This person should get help right away (call 911 in the [place] if  you start to shake and are not able to control it (have seizures), you pass out, or you are unable to wake up. °HOW CAN I PREVENT A HEAD INJURY IN THE FUTURE? °· Wear seat belts. °· Wear a helmet while bike riding and playing sports like football. °· Stay away from dangerous activities around the house. °WHEN CAN I RETURN TO NORMAL ACTIVITIES AND ATHLETICS? °See your doctor before doing these activities. You should not do normal activities or play contact sports until 1 week after the following symptoms have stopped: °· Headache that does not go away. °· Dizziness. °· Poor attention. °· Confusion. °· Memory problems. °· Sickness to your stomach or throwing up. °· Tiredness. °· Fussiness. °· Bothered by bright lights or loud noises. °· Anxiousness or depression. °· Restless sleep. °MAKE SURE YOU:  °· Understand these instructions. °· Will   watch your condition. °· Will get help right away if you are not doing well or get worse. °  °This information is not intended to replace advice given to you by your health care provider. Make sure you discuss any questions you have with your health care provider. °  °Document Released: 01/17/2008 Document Revised: 02/24/2014 Document Reviewed: 10/11/2012 °Elsevier Interactive Patient Education ©2016 [employer] ° °

## 2015-03-14 NOTE — ED Notes (Signed)
Per [facility], pt from [facility] for fall and laceration to his forehead. Pt was found on the floor by staff after his call light went off. Was attempting to put on shoes and fell hitting head on side table. Pt has dementia.

## 2015-03-14 NOTE — ED Provider Notes (Signed)
CSN: [id number]     Arrival date & time 03/14/15  2054 History   First MD Initiated Contact with Patient 03/14/15 2142     Chief Complaint  Patient presents with  . Fall  . Head Laceration     (Consider location/radiation/quality/duration/timing/severity/associated sxs/prior Treatment) Patient is a 75 y.o. male presenting with fall and scalp laceration. The history is provided by the patient. No language interpreter was used.  Fall This is a new problem. The current episode started today. The problem occurs constantly. The problem has been unchanged. Associated symptoms include headaches. Pertinent negatives include no neck pain. Nothing aggravates the symptoms. He has tried nothing for the symptoms. The treatment provided no relief.  Head Laceration Associated symptoms include headaches. Pertinent negatives include no neck pain.  Pt fell and hit his head on an end table.  No loc, not syncope.  [facility] reports facility reported pt at normal mental status. Pt has dementia  Past Medical History  Diagnosis Date  . Dementia   . BPH (benign prostatic hypertrophy)   . Bipolar disorder (HCC)    History reviewed. No pertinent past surgical history. Family History  Problem Relation Age of Onset  . Family history unknown: Yes   Social History  Substance Use Topics  . Smoking status: Former Games developer  . Smokeless tobacco: None  . Alcohol Use: No    Review of Systems  Musculoskeletal: Negative for neck pain.  Neurological: Positive for headaches.  All other systems reviewed and are negative.     Allergies  Review of patient's allergies indicates no known allergies.  Home Medications   Prior to Admission medications   Medication Sig Start Date End Date Taking? Authorizing Provider  allopurinol (ZYLOPRIM) 100 MG tablet Take 100 mg by mouth daily.    Yes Historical Provider, MD  amLODipine (NORVASC) 2.5 MG tablet Take 2.5 mg by mouth daily.   Yes Historical Provider, MD  aspirin 81 MG  tablet Take 81 mg by mouth daily.   Yes Historical Provider, MD  carvedilol (COREG) 6.25 MG tablet Take 9.375 mg by mouth 2 (two) times daily with a meal.   Yes Historical Provider, MD  finasteride (PROSCAR) 5 MG tablet Take 2.5 mg by mouth at bedtime.    Yes Historical Provider, MD  LORazepam (ATIVAN) 0.5 MG tablet Take 0.25 mg by mouth daily as needed for anxiety.   Yes Historical Provider, MD  tamsulosin (FLOMAX) 0.4 MG CAPS capsule Take 0.8 mg by mouth at bedtime.    Yes Historical Provider, MD  carvedilol (COREG) 3.125 MG tablet Take 3.125 mg by mouth 2 (two) times daily with a meal.    Historical Provider, MD  metoprolol tartrate (LOPRESSOR) 25 MG tablet Take 12.5 mg by mouth daily.    Historical Provider, MD   BP 160/100 mmHg  Pulse 82  Temp(Src) 97.9 F (36.6 C) (Oral)  Resp 20  SpO2 98% Physical Exam  Constitutional: He is oriented to person, place, and time. He appears well-developed and well-nourished.  HENT:  Head: Normocephalic.  Right Ear: External ear normal.  Left Ear: External ear normal.  Nose: Nose normal.  Mouth/Throat: Oropharynx is clear and moist.  Eyes: EOM are normal. Pupils are equal, round, and reactive to light.  Neck: Normal range of motion. Neck supple.  Cardiovascular: Normal rate and normal heart sounds.   Pulmonary/Chest: Effort normal.  Abdominal: Soft.  Musculoskeletal: Normal range of motion.  Neurological: He is alert and oriented to person, place, and time. He has  normal reflexes.  Skin: Skin is warm.  Psychiatric: He has a normal mood and affect.  Nursing note and vitals reviewed.   ED Course  .Marland KitchenLaceration Repair Date/Time: 03/14/2015 11:47 PM Performed by: Elson Areas Authorized by: Elson Areas Consent: Verbal consent obtained. Consent given by: patient Body area: head/neck Location details: scalp Laceration length: 11 cm Foreign bodies: no foreign bodies Tendon involvement: none Nerve involvement: none Vascular damage:  yes Local anesthetic: lidocaine 1% with epinephrine Patient sedated: no Preparation: Patient was prepped and draped in the usual sterile fashion. Irrigation solution: saline Debridement: none Skin closure: 5-0 Prolene Subcutaneous closure: 3-0 Vicryl Fascia closure: 3-0 Vicryl Number of sutures: 9 Technique: simple Approximation: loose Approximation difficulty: simple Patient tolerance: Patient tolerated the procedure well with no immediate complications Comments: 2 vessels tied with 3.0 vicryl,  3 vicryl to approximate subq.  (pt had a large amount of bleeding from 2 separate vessels, largest appears venous, smaller vessel arterial)   CRITICAL CARE Performed by: [facility] Total critical care time: 30 minutes Pt had arterial bleeding. Extended time holding pressure, reevaluated and assessing for blood loss. Critical care time was exclusive of separately billable procedures and treating other patients. Critical care was necessary to treat or prevent imminent or life-threatening deterioration. Critical care was time spent personally by me on the following activities: development of treatment plan with patient and/or surrogate as well as nursing, discussions with consultants, evaluation of patient's response to treatment, examination of patient, obtaining history from patient or surrogate, ordering and performing treatments and interventions, ordering and review of laboratory studies, ordering and review of radiographic studies, pulse oximetry and re-evaluation of patient's condition. Labs Review Labs Reviewed  BASIC METABOLIC PANEL - Abnormal; Notable for the following:    Glucose, Bld 100 (*)    Creatinine, Ser 1.36 (*)    GFR calc non Af Amer 49 (*)    GFR calc Af Amer 57 (*)    All other components within normal limits  CBC WITH DIFFERENTIAL/PLATELET    Imaging Review Ct Head Wo Contrast  03/14/2015  CLINICAL DATA:  Status post fall, with laceration at the forehead. Hit head on  side table. Concern for cervical spine injury. Initial encounter. EXAM: CT HEAD WITHOUT CONTRAST CT CERVICAL SPINE WITHOUT CONTRAST TECHNIQUE: Multidetector CT imaging of the head and cervical spine was performed following the standard protocol without intravenous contrast. Multiplanar CT image reconstructions of the cervical spine were also generated. COMPARISON:  CT of the head and cervical spine performed 01/29/2015 FINDINGS: CT HEAD FINDINGS There is no evidence of acute infarction, mass lesion, or intra- or extra-axial hemorrhage on CT. Prominence of the ventricles and sulci reflects mild to moderate cortical volume loss. Mild cerebellar atrophy is suggested. Mild periventricular and subcortical white matter change likely reflects small vessel ischemic microangiopathy. The brainstem and fourth ventricle are within normal limits. The basal ganglia are unremarkable in appearance. The cerebral hemispheres demonstrate grossly normal gray-white differentiation. No mass effect or midline shift is seen. There appears to be a minimally displaced slightly comminuted fracture involving the left side of the nasal bone. There is chronic deformity of the medial wall of the left orbit. The orbits are otherwise within normal limits. Mild mucosal thickening is noted at the maxillary sinuses bilaterally. The remaining paranasal sinuses and mastoid air cells are well-aerated. Diffuse soft tissue swelling is noted overlying the frontal calvarium, with scattered soft tissue air tracking about the bridge of the nose. CT CERVICAL SPINE FINDINGS There is  no evidence of fracture or subluxation. Vertebral bodies demonstrate normal alignment. Multilevel disc space narrowing is noted along the cervical spine, with mild chronic loss of height at multiple vertebral bodies, and scattered anterior and posterior disc osteophyte complexes. Prevertebral soft tissues are within normal limits. Vague small hypodensities are noted within the  thyroid gland, without evidence of a dominant mass. The visualized lung apices are clear. No significant soft tissue abnormalities are seen. IMPRESSION: 1. No evidence of traumatic intracranial injury. 2. No evidence of fracture or subluxation along the cervical spine. 3. Apparent minimally displaced slightly comminuted fracture involving the left side of the nasal bone. 4. Diffuse soft tissue swelling overlying the frontal calvarium, with scattered soft tissue air tracking about the bridge of the nose. 5. Mild to moderate cortical volume loss and scattered small vessel ischemic microangiopathy. 6. Mild diffuse degenerative change along the cervical spine. 7. Sub-centimeter thyroid nodules noted, too small to characterize, but most likely benign. Electronically Signed   By: Roanna Raider M.D.   On: 03/14/2015 23:18   Ct Cervical Spine Wo Contrast  03/14/2015  CLINICAL DATA:  Status post fall, with laceration at the forehead. Hit head on side table. Concern for cervical spine injury. Initial encounter. EXAM: CT HEAD WITHOUT CONTRAST CT CERVICAL SPINE WITHOUT CONTRAST TECHNIQUE: Multidetector CT imaging of the head and cervical spine was performed following the standard protocol without intravenous contrast. Multiplanar CT image reconstructions of the cervical spine were also generated. COMPARISON:  CT of the head and cervical spine performed 01/29/2015 FINDINGS: CT HEAD FINDINGS There is no evidence of acute infarction, mass lesion, or intra- or extra-axial hemorrhage on CT. Prominence of the ventricles and sulci reflects mild to moderate cortical volume loss. Mild cerebellar atrophy is suggested. Mild periventricular and subcortical white matter change likely reflects small vessel ischemic microangiopathy. The brainstem and fourth ventricle are within normal limits. The basal ganglia are unremarkable in appearance. The cerebral hemispheres demonstrate grossly normal gray-white differentiation. No mass effect or  midline shift is seen. There appears to be a minimally displaced slightly comminuted fracture involving the left side of the nasal bone. There is chronic deformity of the medial wall of the left orbit. The orbits are otherwise within normal limits. Mild mucosal thickening is noted at the maxillary sinuses bilaterally. The remaining paranasal sinuses and mastoid air cells are well-aerated. Diffuse soft tissue swelling is noted overlying the frontal calvarium, with scattered soft tissue air tracking about the bridge of the nose. CT CERVICAL SPINE FINDINGS There is no evidence of fracture or subluxation. Vertebral bodies demonstrate normal alignment. Multilevel disc space narrowing is noted along the cervical spine, with mild chronic loss of height at multiple vertebral bodies, and scattered anterior and posterior disc osteophyte complexes. Prevertebral soft tissues are within normal limits. Vague small hypodensities are noted within the thyroid gland, without evidence of a dominant mass. The visualized lung apices are clear. No significant soft tissue abnormalities are seen. IMPRESSION: 1. No evidence of traumatic intracranial injury. 2. No evidence of fracture or subluxation along the cervical spine. 3. Apparent minimally displaced slightly comminuted fracture involving the left side of the nasal bone. 4. Diffuse soft tissue swelling overlying the frontal calvarium, with scattered soft tissue air tracking about the bridge of the nose. 5. Mild to moderate cortical volume loss and scattered small vessel ischemic microangiopathy. 6. Mild diffuse degenerative change along the cervical spine. 7. Sub-centimeter thyroid nodules noted, too small to characterize, but most likely benign. Electronically Signed  By: Roanna Raider M.D.   On: 03/14/2015 23:18   I have personally reviewed and evaluated these images and lab results as part of my medical decision-making.   EKG Interpretation None      MDM    Final  diagnoses:  Laceration of face, initial encounter    Suture removal in 7 days    Elson Areas, PA-C 03/15/15 0002  Raeford Razor, MD 03/18/15 [phone]

## 2015-03-14 NOTE — ED Notes (Signed)
Clydie Braun, PA in the room, suture cart gotten and pt numbed by PA.

## 2015-03-15 ENCOUNTER — Emergency Department (HOSPITAL_COMMUNITY)
Admission: EM | Admit: 2015-03-15 | Discharge: 2015-03-15 | Disposition: A | Discharge status: Home / Self Care | Payer: Medicare Other | Source: Home / Self Care | Attending: Emergency Medicine | Admitting: Emergency Medicine

## 2015-03-15 ENCOUNTER — Emergency Department (HOSPITAL_COMMUNITY): Payer: Medicare Other

## 2015-03-15 ENCOUNTER — Encounter (HOSPITAL_COMMUNITY): Payer: Self-pay | Admitting: *Deleted

## 2015-03-15 DIAGNOSIS — Z87891 Personal history of nicotine dependence: Secondary | ICD-10-CM | POA: Insufficient documentation

## 2015-03-15 DIAGNOSIS — Z8659 Personal history of other mental and behavioral disorders: Secondary | ICD-10-CM

## 2015-03-15 DIAGNOSIS — S022XXD Fracture of nasal bones, subsequent encounter for fracture with routine healing: Secondary | ICD-10-CM

## 2015-03-15 DIAGNOSIS — N400 Enlarged prostate without lower urinary tract symptoms: Secondary | ICD-10-CM

## 2015-03-15 DIAGNOSIS — Z7982 Long term (current) use of aspirin: Secondary | ICD-10-CM

## 2015-03-15 DIAGNOSIS — S0010XA Contusion of unspecified eyelid and periocular area, initial encounter: Secondary | ICD-10-CM

## 2015-03-15 DIAGNOSIS — W01198D Fall on same level from slipping, tripping and stumbling with subsequent striking against other object, subsequent encounter: Secondary | ICD-10-CM

## 2015-03-15 DIAGNOSIS — F0390 Unspecified dementia without behavioral disturbance: Secondary | ICD-10-CM

## 2015-03-15 DIAGNOSIS — S0181XD Laceration without foreign body of other part of head, subsequent encounter: Secondary | ICD-10-CM

## 2015-03-15 DIAGNOSIS — Z79899 Other long term (current) drug therapy: Secondary | ICD-10-CM | POA: Insufficient documentation

## 2015-03-15 LAB — CBC
HEMATOCRIT: 36.8 % — AB (ref 39.0–52.0)
HEMOGLOBIN: 12.2 g/dL — AB (ref 13.0–17.0)
MCH: 30.5 pg (ref 26.0–34.0)
MCHC: 33.2 g/dL (ref 30.0–36.0)
MCV: 92 fL (ref 78.0–100.0)
Platelets: 241 10*3/uL (ref 150–400)
RBC: 4 MIL/uL — ABNORMAL LOW (ref 4.22–5.81)
RDW: 12.8 % (ref 11.5–15.5)
WBC: 7.1 10*3/uL (ref 4.0–10.5)

## 2015-03-15 LAB — COMPREHENSIVE METABOLIC PANEL
ALBUMIN: 3.2 g/dL — AB (ref 3.5–5.0)
ALT: 21 U/L (ref 17–63)
ANION GAP: 4 — AB (ref 5–15)
AST: 43 U/L — AB (ref 15–41)
Alkaline Phosphatase: 55 U/L (ref 38–126)
BILIRUBIN TOTAL: 1.2 mg/dL (ref 0.3–1.2)
BUN: 12 mg/dL (ref 6–20)
CHLORIDE: 106 mmol/L (ref 101–111)
CO2: 28 mmol/L (ref 22–32)
Calcium: 8.9 mg/dL (ref 8.9–10.3)
Creatinine, Ser: 1.36 mg/dL — ABNORMAL HIGH (ref 0.61–1.24)
GFR calc Af Amer: 57 mL/min — ABNORMAL LOW (ref >60)
GFR calc non Af Amer: 49 mL/min — ABNORMAL LOW (ref >60)
GLUCOSE: 109 mg/dL — AB (ref 65–99)
POTASSIUM: 4.9 mmol/L (ref 3.5–5.1)
SODIUM: 138 mmol/L (ref 135–145)
TOTAL PROTEIN: 6.1 g/dL — AB (ref 6.5–8.1)

## 2015-03-15 LAB — URINALYSIS, ROUTINE W REFLEX MICROSCOPIC
BILIRUBIN URINE: NEGATIVE
GLUCOSE, UA: NEGATIVE mg/dL
HGB URINE DIPSTICK: NEGATIVE
Ketones, ur: NEGATIVE mg/dL
Leukocytes, UA: NEGATIVE
Nitrite: NEGATIVE
PH: 7 (ref 5.0–8.0)
Protein, ur: NEGATIVE mg/dL
SPECIFIC GRAVITY, URINE: 1.015 (ref 1.005–1.030)

## 2015-03-15 LAB — CBG MONITORING, ED: Glucose-Capillary: 91 mg/dL (ref 65–99)

## 2015-03-15 LAB — I-STAT CG4 LACTIC ACID, ED: Lactic Acid, Venous: 1.14 mmol/L (ref 0.5–2.0)

## 2015-03-15 NOTE — Discharge Instructions (Signed)
Return to your facility. °

## 2015-03-15 NOTE — ED Notes (Signed)
[facility] contacted to transport patient to living facility

## 2015-03-15 NOTE — ED Notes (Signed)
Pt fell last night out of bed and hit his head on the night stand and was evaluated here. This morning pt was hard to arouse and had some nausea and vomiting. Per facility pt is "just not himself"

## 2015-03-15 NOTE — ED Notes (Signed)
CBG 91 

## 2015-03-15 NOTE — ED Notes (Signed)
Notified [facility] for transportation back home 

## 2015-03-15 NOTE — ED Provider Notes (Signed)
CSN: [id number]     Arrival date & time 03/15/15  1030 History   First MD Initiated Contact with Patient 03/15/15 1031     Chief Complaint  Patient presents with  . Altered Mental Status      HPI  Patient presents for evaluation, transferred from his assisted living facility where he resides. Complaint is pain at the laceration site and difficulty with vision at home facility.  Patient seen and evaluated here last night. Discharged at bedtime back to his care facility. Had a fall and struck his forehead against a night table. He had a large horizontal forehead laceration that was repaired and a normal CT. Behavior at that point was reported as normal.  Patient returns this morning from the care facility. Eventually, his son arrives. His son works at the same assisted living facility which the patient arrives. Son states that he got a call from the nurse at the facility this morning that he was having trouble seeing and he was "tired more than usual, and he looked really swollen".  Son denies that his father's behavior is normal other than he is just "not as chipper as he normally is and not as talkative".  States his father's had a slow decline over the last year with more frequent falls.  Past Medical History  Diagnosis Date  . Dementia   . BPH (benign prostatic hypertrophy)   . Bipolar disorder (HCC)    History reviewed. No pertinent past surgical history. Family History  Problem Relation Age of Onset  . Family history unknown: Yes   Social History  Substance Use Topics  . Smoking status: Former Games developer  . Smokeless tobacco: None  . Alcohol Use: No    Review of Systems  Unable to perform ROS: Dementia      Allergies  Review of patient's allergies indicates no known allergies.  Home Medications   Prior to Admission medications   Medication Sig Start Date End Date Taking? Authorizing Provider  allopurinol (ZYLOPRIM) 100 MG tablet Take 200 mg by mouth daily.    Yes  Historical Provider, MD  amLODipine (NORVASC) 10 MG tablet Take 10 mg by mouth daily.   Yes Historical Provider, MD  aspirin 81 MG tablet Take 81 mg by mouth daily.   Yes Historical Provider, MD  atorvastatin (LIPITOR) 10 MG tablet Take 10 mg by mouth daily.   Yes Historical Provider, MD  carvedilol (COREG) 6.25 MG tablet Take 6.25 mg by mouth 2 (two) times daily with a meal.    Yes Historical Provider, MD  finasteride (PROSCAR) 5 MG tablet Take 5 mg by mouth at bedtime.    Yes Historical Provider, MD  LORazepam (ATIVAN) 0.5 MG tablet Take 0.25 mg by mouth daily as needed for anxiety.   Yes Historical Provider, MD  tamsulosin (FLOMAX) 0.4 MG CAPS capsule Take 0.8 mg by mouth at bedtime.    Yes Historical Provider, MD   BP 126/87 mmHg  Pulse 107  Temp(Src) 97.8 F (36.6 C) (Oral)  Resp 23  Ht 5\' 7"  (1.702 m)  Wt 194 lb (87.998 kg)  BMI 30.38 kg/m2  SpO2 99% Physical Exam  Constitutional: He appears well-developed and well-nourished. No distress.  HENT:  Head: Normocephalic.    Eyes: Conjunctivae are normal. Pupils are equal, round, and reactive to light. No scleral icterus.  Neck: Normal range of motion. Neck supple. No thyromegaly present.  Cardiovascular: Normal rate and regular rhythm.  Exam reveals no gallop and no friction rub.  No murmur heard. Pulmonary/Chest: Effort normal and breath sounds normal. No respiratory distress. He has no wheezes. He has no rales.  Abdominal: Soft. Bowel sounds are normal. He exhibits no distension. There is no tenderness. There is no rebound.  Musculoskeletal: Normal range of motion.  Neurological: He is alert.  Skin: Skin is warm and dry. No rash noted.  Psychiatric: He has a normal mood and affect. His behavior is normal.    ED Course  Procedures (including critical care time) Labs Review Labs Reviewed  COMPREHENSIVE METABOLIC PANEL - Abnormal; Notable for the following:    Glucose, Bld 109 (*)    Creatinine, Ser 1.36 (*)    Total  Protein 6.1 (*)    Albumin 3.2 (*)    AST 43 (*)    GFR calc non Af Amer 49 (*)    GFR calc Af Amer 57 (*)    Anion gap 4 (*)    All other components within normal limits  CBC - Abnormal; Notable for the following:    RBC 4.00 (*)    Hemoglobin 12.2 (*)    HCT 36.8 (*)    All other components within normal limits  URINALYSIS, ROUTINE W REFLEX MICROSCOPIC (NOT AT [facility])  CBG MONITORING, ED  I-STAT CG4 LACTIC ACID, ED    Imaging Review Ct Head Wo Contrast  03/15/2015  CLINICAL DATA:  Larey Seat yesterday and hit head. Acute mental status changes this morning. EXAM: CT HEAD WITHOUT CONTRAST TECHNIQUE: Contiguous axial images were obtained from the base of the skull through the vertex without intravenous contrast. COMPARISON:  CT scan from March 14, 2015 FINDINGS: Mucosal thickening is seen in the ethmoid and maxillary sinuses with no acute sinus abnormalities. The mastoid air cells and middle ears are well aerated. Left nasal bone fractures are again seen. No other acute fractures are seen on today's study. Anterior soft tissue swelling persists, consistent with recent trauma. No other extracranial soft tissue abnormalities. The globes are intact. No subdural, epidural, or subarachnoid hemorrhage. The cerebellum, brainstem, and basal cisterns are within normal limits. No acute cortical ischemia or infarct. The ventricles and sulci are prominent but stable. No mass, mass effect, or midline shift IMPRESSION: 1. Left nasal bone fractures. No acute interval changes. No bleed. No acute ischemia or infarct. Electronically Signed   By: Gerome Sam III M.D   On: 03/15/2015 11:39   Ct Head Wo Contrast  03/14/2015  CLINICAL DATA:  Status post fall, with laceration at the forehead. Hit head on side table. Concern for cervical spine injury. Initial encounter. EXAM: CT HEAD WITHOUT CONTRAST CT CERVICAL SPINE WITHOUT CONTRAST TECHNIQUE: Multidetector CT imaging of the head and cervical spine was performed  following the standard protocol without intravenous contrast. Multiplanar CT image reconstructions of the cervical spine were also generated. COMPARISON:  CT of the head and cervical spine performed 01/29/2015 FINDINGS: CT HEAD FINDINGS There is no evidence of acute infarction, mass lesion, or intra- or extra-axial hemorrhage on CT. Prominence of the ventricles and sulci reflects mild to moderate cortical volume loss. Mild cerebellar atrophy is suggested. Mild periventricular and subcortical white matter change likely reflects small vessel ischemic microangiopathy. The brainstem and fourth ventricle are within normal limits. The basal ganglia are unremarkable in appearance. The cerebral hemispheres demonstrate grossly normal gray-white differentiation. No mass effect or midline shift is seen. There appears to be a minimally displaced slightly comminuted fracture involving the left side of the nasal bone. There is chronic deformity of the  medial wall of the left orbit. The orbits are otherwise within normal limits. Mild mucosal thickening is noted at the maxillary sinuses bilaterally. The remaining paranasal sinuses and mastoid air cells are well-aerated. Diffuse soft tissue swelling is noted overlying the frontal calvarium, with scattered soft tissue air tracking about the bridge of the nose. CT CERVICAL SPINE FINDINGS There is no evidence of fracture or subluxation. Vertebral bodies demonstrate normal alignment. Multilevel disc space narrowing is noted along the cervical spine, with mild chronic loss of height at multiple vertebral bodies, and scattered anterior and posterior disc osteophyte complexes. Prevertebral soft tissues are within normal limits. Vague small hypodensities are noted within the thyroid gland, without evidence of a dominant mass. The visualized lung apices are clear. No significant soft tissue abnormalities are seen. IMPRESSION: 1. No evidence of traumatic intracranial injury. 2. No evidence of  fracture or subluxation along the cervical spine. 3. Apparent minimally displaced slightly comminuted fracture involving the left side of the nasal bone. 4. Diffuse soft tissue swelling overlying the frontal calvarium, with scattered soft tissue air tracking about the bridge of the nose. 5. Mild to moderate cortical volume loss and scattered small vessel ischemic microangiopathy. 6. Mild diffuse degenerative change along the cervical spine. 7. Sub-centimeter thyroid nodules noted, too small to characterize, but most likely benign. Electronically Signed   By: Roanna Raider M.D.   On: 03/14/2015 23:18   Ct Cervical Spine Wo Contrast  03/14/2015  CLINICAL DATA:  Status post fall, with laceration at the forehead. Hit head on side table. Concern for cervical spine injury. Initial encounter. EXAM: CT HEAD WITHOUT CONTRAST CT CERVICAL SPINE WITHOUT CONTRAST TECHNIQUE: Multidetector CT imaging of the head and cervical spine was performed following the standard protocol without intravenous contrast. Multiplanar CT image reconstructions of the cervical spine were also generated. COMPARISON:  CT of the head and cervical spine performed 01/29/2015 FINDINGS: CT HEAD FINDINGS There is no evidence of acute infarction, mass lesion, or intra- or extra-axial hemorrhage on CT. Prominence of the ventricles and sulci reflects mild to moderate cortical volume loss. Mild cerebellar atrophy is suggested. Mild periventricular and subcortical white matter change likely reflects small vessel ischemic microangiopathy. The brainstem and fourth ventricle are within normal limits. The basal ganglia are unremarkable in appearance. The cerebral hemispheres demonstrate grossly normal gray-white differentiation. No mass effect or midline shift is seen. There appears to be a minimally displaced slightly comminuted fracture involving the left side of the nasal bone. There is chronic deformity of the medial wall of the left orbit. The orbits are  otherwise within normal limits. Mild mucosal thickening is noted at the maxillary sinuses bilaterally. The remaining paranasal sinuses and mastoid air cells are well-aerated. Diffuse soft tissue swelling is noted overlying the frontal calvarium, with scattered soft tissue air tracking about the bridge of the nose. CT CERVICAL SPINE FINDINGS There is no evidence of fracture or subluxation. Vertebral bodies demonstrate normal alignment. Multilevel disc space narrowing is noted along the cervical spine, with mild chronic loss of height at multiple vertebral bodies, and scattered anterior and posterior disc osteophyte complexes. Prevertebral soft tissues are within normal limits. Vague small hypodensities are noted within the thyroid gland, without evidence of a dominant mass. The visualized lung apices are clear. No significant soft tissue abnormalities are seen. IMPRESSION: 1. No evidence of traumatic intracranial injury. 2. No evidence of fracture or subluxation along the cervical spine. 3. Apparent minimally displaced slightly comminuted fracture involving the left side of the nasal  bone. 4. Diffuse soft tissue swelling overlying the frontal calvarium, with scattered soft tissue air tracking about the bridge of the nose. 5. Mild to moderate cortical volume loss and scattered small vessel ischemic microangiopathy. 6. Mild diffuse degenerative change along the cervical spine. 7. Sub-centimeter thyroid nodules noted, too small to characterize, but most likely benign. Electronically Signed   By: Roanna Raider M.D.   On: 03/14/2015 23:18   Dg Chest Port 1 View  03/15/2015  CLINICAL DATA:  75 year old who fell and struck his head on a coffee table. Acute mental status changes related to baseline in this patient with history of dementia. EXAM: PORTABLE CHEST 1 VIEW COMPARISON:  09/26/2014 and earlier. FINDINGS: Cardiac silhouette mildly enlarged for AP portable technique, unchanged. Thoracic aorta tortuous and  atherosclerotic, unchanged. Hilar and mediastinal contours otherwise unremarkable. Lungs clear. Bronchovascular markings normal. Pulmonary vascularity normal. No visible pleural effusions. No pneumothorax. IMPRESSION: No acute cardiopulmonary disease. Electronically Signed   By: Hulan Saas M.D.   On: 03/15/2015 11:17   I have personally reviewed and evaluated these images and lab results as part of my medical decision-making.   EKG Interpretation None      MDM   Final diagnoses:  Forehead laceration, subsequent encounter  Periorbital ecchymosis, unspecified laterality, initial encounter  Nasal fracture, with routine healing, subsequent encounter    The patient's son does not feel he will do well at his current assisted living facility. We will inquire with social work regarding the possibility of a higher level of care or sniff unit. Patient does have supra and infraorbital edema that does limit his vision. No other or obvious indications for acute inpatient admission.    Rolland Porter, MD 03/15/15 1550

## 2015-03-26 ENCOUNTER — Emergency Department (HOSPITAL_COMMUNITY)
Admission: EM | Admit: 2015-03-26 | Discharge: 2015-03-27 | Disposition: A | Discharge status: Home / Self Care | Payer: Medicare Other | Attending: Emergency Medicine | Admitting: Emergency Medicine

## 2015-03-26 ENCOUNTER — Emergency Department (HOSPITAL_COMMUNITY): Payer: Medicare Other

## 2015-03-26 ENCOUNTER — Encounter (HOSPITAL_COMMUNITY): Payer: Self-pay | Admitting: *Deleted

## 2015-03-26 DIAGNOSIS — Y998 Other external cause status: Secondary | ICD-10-CM | POA: Insufficient documentation

## 2015-03-26 DIAGNOSIS — S0081XA Abrasion of other part of head, initial encounter: Secondary | ICD-10-CM | POA: Insufficient documentation

## 2015-03-26 DIAGNOSIS — W1839XA Other fall on same level, initial encounter: Secondary | ICD-10-CM | POA: Insufficient documentation

## 2015-03-26 DIAGNOSIS — I10 Essential (primary) hypertension: Secondary | ICD-10-CM | POA: Diagnosis not present

## 2015-03-26 DIAGNOSIS — S0990XA Unspecified injury of head, initial encounter: Secondary | ICD-10-CM | POA: Diagnosis present

## 2015-03-26 DIAGNOSIS — F319 Bipolar disorder, unspecified: Secondary | ICD-10-CM | POA: Insufficient documentation

## 2015-03-26 DIAGNOSIS — Z87891 Personal history of nicotine dependence: Secondary | ICD-10-CM | POA: Insufficient documentation

## 2015-03-26 DIAGNOSIS — Z7982 Long term (current) use of aspirin: Secondary | ICD-10-CM | POA: Insufficient documentation

## 2015-03-26 DIAGNOSIS — Z79899 Other long term (current) drug therapy: Secondary | ICD-10-CM | POA: Insufficient documentation

## 2015-03-26 DIAGNOSIS — Y9289 Other specified places as the place of occurrence of the external cause: Secondary | ICD-10-CM | POA: Diagnosis not present

## 2015-03-26 DIAGNOSIS — N400 Enlarged prostate without lower urinary tract symptoms: Secondary | ICD-10-CM | POA: Diagnosis not present

## 2015-03-26 DIAGNOSIS — Y9389 Activity, other specified: Secondary | ICD-10-CM | POA: Insufficient documentation

## 2015-03-26 DIAGNOSIS — F0390 Unspecified dementia without behavioral disturbance: Secondary | ICD-10-CM | POA: Insufficient documentation

## 2015-03-26 DIAGNOSIS — W19XXXA Unspecified fall, initial encounter: Secondary | ICD-10-CM

## 2015-03-26 HISTORY — DX: Essential (primary) hypertension: I10

## 2015-03-26 LAB — URINALYSIS, ROUTINE W REFLEX MICROSCOPIC
Bilirubin Urine: NEGATIVE
GLUCOSE, UA: NEGATIVE mg/dL
HGB URINE DIPSTICK: NEGATIVE
Ketones, ur: NEGATIVE mg/dL
LEUKOCYTES UA: NEGATIVE
Nitrite: NEGATIVE
PH: 5 (ref 5.0–8.0)
PROTEIN: NEGATIVE mg/dL
SPECIFIC GRAVITY, URINE: 1.025 (ref 1.005–1.030)

## 2015-03-26 LAB — CBC WITH DIFFERENTIAL/PLATELET
Basophils Absolute: 0 10*3/uL (ref 0.0–0.1)
Basophils Relative: 1 %
EOS PCT: 2 %
Eosinophils Absolute: 0.1 10*3/uL (ref 0.0–0.7)
HEMATOCRIT: 34.3 % — AB (ref 39.0–52.0)
Hemoglobin: 11.6 g/dL — ABNORMAL LOW (ref 13.0–17.0)
LYMPHS ABS: 2.2 10*3/uL (ref 0.7–4.0)
LYMPHS PCT: 36 %
MCH: 31.3 pg (ref 26.0–34.0)
MCHC: 33.8 g/dL (ref 30.0–36.0)
MCV: 92.5 fL (ref 78.0–100.0)
MONO ABS: 0.5 10*3/uL (ref 0.1–1.0)
MONOS PCT: 9 %
NEUTROS ABS: 3.2 10*3/uL (ref 1.7–7.7)
Neutrophils Relative %: 52 %
Platelets: 304 10*3/uL (ref 150–400)
RBC: 3.71 MIL/uL — ABNORMAL LOW (ref 4.22–5.81)
RDW: 13.1 % (ref 11.5–15.5)
WBC: 6 10*3/uL (ref 4.0–10.5)

## 2015-03-26 NOTE — ED Notes (Signed)
Per EMS report: pt is from [facility]. Staff found pt on all fours with his head against the wall.  Pt then lowered himself to the ground. Pt did not hit his head or lose consciousness nor is pt on blood thinners. Pt has an abrasion on the top of pt's head where the pt had been rubbing his head against the wall. Pt denies any pain.  Nursing home staff denied any hx of dementia.  Pt a/o x 4 and is normally wheelchair bound.  Pt currently sleepy but was given night time medications which includes Ativan.

## 2015-03-26 NOTE — ED Notes (Signed)
Bed: [facility] Expected date:  Expected time:  Means of arrival:  Comments: EMS- 75yo M, fall/head abrasion/denies thinners

## 2015-03-26 NOTE — ED Provider Notes (Signed)
CSN: [id number]     Arrival date & time 03/26/15  1912 History   First MD Initiated Contact with Patient 03/26/15 1922     Chief Complaint  Patient presents with  . Fall     (Consider location/radiation/quality/duration/timing/severity/associated sxs/prior Treatment) The history is provided by the patient.      Depaul Arizpe is a 75 y.o. male with PMH significant for Dementia, BPH, BPD, and HTN who presents with fall sustained earlier today.  Patient reports he fell face first into a wall earlier today.  He is now complaining of mild, constant, pain at the top of his head.  He denies LOC.  He denies CP, SOB, abdominal pain, urinary symptoms, or pain anywhere else.  Per EMS report: patient is from [facility].  Patient was found on all fours with his head against the wall.  He then lowered himself to the ground.  No head injury or LOC.  He is not on anticoagulants.  There is an abrasion to the top of his head where the patient has been rubbing his head against the wall.  Alert and oriented x 4 and normally wheelchair bound.  He just received night time meds which include Ativan.    Past Medical History  Diagnosis Date  . Dementia   . BPH (benign prostatic hypertrophy)   . Bipolar disorder (HCC)   . Hypertension    History reviewed. No pertinent past surgical history. Family History  Problem Relation Age of Onset  . Family history unknown: Yes   Social History  Substance Use Topics  . Smoking status: Former Games developer  . Smokeless tobacco: None  . Alcohol Use: No    Review of Systems All other systems negative unless otherwise stated in HPI    Allergies  Review of patient's allergies indicates no known allergies.  Home Medications   Prior to Admission medications   Medication Sig Start Date End Date Taking? Authorizing Provider  allopurinol (ZYLOPRIM) 100 MG tablet Take 100 mg by mouth daily.    Yes Historical Provider, MD  amLODipine (NORVASC) 2.5 MG tablet Take  2.5 mg by mouth daily.   Yes Historical Provider, MD  aspirin 81 MG tablet Take 81 mg by mouth daily.   Yes Historical Provider, MD  carvedilol (COREG) 6.25 MG tablet Take 9.375 mg by mouth 2 (two) times daily with a meal.    Yes Historical Provider, MD  finasteride (PROSCAR) 5 MG tablet Take 2.5 mg by mouth at bedtime.    Yes Historical Provider, MD  ibuprofen (ADVIL,MOTRIN) 200 MG tablet Take 200 mg by mouth 3 (three) times daily as needed for moderate pain.   Yes Historical Provider, MD  ibuprofen (ADVIL,MOTRIN) 600 MG tablet Take 600 mg by mouth 3 (three) times daily with meals as needed for moderate pain.   Yes Historical Provider, MD  LORazepam (ATIVAN) 0.5 MG tablet Take 0.5 mg by mouth daily as needed for anxiety.    Yes Historical Provider, MD  tamsulosin (FLOMAX) 0.4 MG CAPS capsule Take 0.4 mg by mouth at bedtime.   Yes Historical Provider, MD   BP 147/93 mmHg  Pulse 73  Temp(Src) 98.1 F (36.7 C) (Oral)  Resp 20  SpO2 96% Physical Exam  Constitutional: He is oriented to person, place, and time. He appears well-developed and well-nourished.  Non-toxic appearance. He does not have a sickly appearance. He does not appear ill.  HENT:  Head: Normocephalic.  Mouth/Throat: Oropharynx is clear and moist.  2x2 cm superficial  abrasion to anterior-posterior parietal region.  No signs of infection or bleeding. Minimally TTP. No swelling, ecchymosis, or hematoma.   Eyes: Conjunctivae are normal. Pupils are equal, round, and reactive to light.  Neck: Normal range of motion. Neck supple.  No cervical midline or paraspinal tenderness.  Cardiovascular: Normal rate, regular rhythm and normal heart sounds.   No murmur heard. Pulmonary/Chest: Effort normal and breath sounds normal. No accessory muscle usage or stridor. No respiratory distress. He has no wheezes. He has no rhonchi. He has no rales.  Abdominal: Soft. Bowel sounds are normal. He exhibits no distension. There is no tenderness.   Musculoskeletal: Normal range of motion.  Lymphadenopathy:    He has no cervical adenopathy.  Neurological: He is alert and oriented to person, place, and time. He has normal strength. No cranial nerve deficit or sensory deficit. GCS eye subscore is 4. GCS verbal subscore is 5. GCS motor subscore is 6.  Speech clear without dysarthria.  Cranial nerves grossly intact. Strength and sensation intact.   Skin: Skin is warm and dry.  Psychiatric: He has a normal mood and affect. His behavior is normal.    ED Course  Procedures (including critical care time) Labs Review Labs Reviewed  CBC WITH DIFFERENTIAL/PLATELET - Abnormal; Notable for the following:    RBC 3.71 (*)    Hemoglobin 11.6 (*)    HCT 34.3 (*)    All other components within normal limits  URINALYSIS, ROUTINE W REFLEX MICROSCOPIC (NOT AT [facility])  COMPREHENSIVE METABOLIC PANEL    Imaging Review Dg Chest 2 View  03/26/2015  CLINICAL DATA:  Weakness. Somnolence. Reported fall today. Found on floor at nursing home. EXAM: CHEST  2 VIEW COMPARISON:  03/15/2015. FINDINGS: Normal cardiomediastinal silhouette. Tortuous aorta. No active infiltrates or failure. No effusion or pneumothorax. No rib fracture or thoracic compression deformity. Similar appearance to priors. IMPRESSION: No active cardiopulmonary disease. Electronically Signed   By: Elsie Stain M.D.   On: 03/26/2015 23:06   Ct Head Wo Contrast  03/26/2015  CLINICAL DATA:  Fall.  Abrasion to top of head.  Hypertension. EXAM: CT HEAD WITHOUT CONTRAST CT CERVICAL SPINE WITHOUT CONTRAST TECHNIQUE: Multidetector CT imaging of the head and cervical spine was performed following the standard protocol without intravenous contrast. Multiplanar CT image reconstructions of the cervical spine were also generated. COMPARISON:  03/15/2015 head CT. Head and cervical spine CT of 03/13/2014. FINDINGS: CT HEAD FINDINGS Sinuses/Soft tissues: No significant soft tissue swelling. Mild mucosal thickening  of ethmoid air cells. Clear mastoid air cells. Intracranial: Cerebral volume loss which is age expected. Moderate low density in the periventricular white matter likely related to small vessel disease. No mass lesion, hemorrhage, hydrocephalus, acute infarct, intra-axial, or extra-axial fluid collection. CT CERVICAL SPINE FINDINGS Spinal visualization through the bottom of T2. Prevertebral soft tissues are within normal limits. No apical pneumothorax. Advanced multilevel cervical spondylosis. Results in areas of significant central canal and neural foraminal narrowing bilaterally. Most severe at C4-5, C5-6. Skull base intact. Straightening of expected cervical lordosis. Maintenance of vertebral body height. Approximately C6 inferiorly are suboptimally evaluated secondary to overlying soft tissues. Facets are well-aligned. Coronal reformats demonstrate a normal C1-C2 articulation. IMPRESSION: 1.  No acute intracranial abnormality. 2. Advanced cervical spondylosis, without acute fracture or subluxation. Straightening of expected cervical lordosis could be positional, due to muscular spasm, or ligamentous injury. 3. Sinus disease. Electronically Signed   By: Jeronimo Greaves M.D.   On: 03/26/2015 21:16   Ct Cervical Spine Wo  Contrast  03/26/2015  CLINICAL DATA:  Fall.  Abrasion to top of head.  Hypertension. EXAM: CT HEAD WITHOUT CONTRAST CT CERVICAL SPINE WITHOUT CONTRAST TECHNIQUE: Multidetector CT imaging of the head and cervical spine was performed following the standard protocol without intravenous contrast. Multiplanar CT image reconstructions of the cervical spine were also generated. COMPARISON:  03/15/2015 head CT. Head and cervical spine CT of 03/13/2014. FINDINGS: CT HEAD FINDINGS Sinuses/Soft tissues: No significant soft tissue swelling. Mild mucosal thickening of ethmoid air cells. Clear mastoid air cells. Intracranial: Cerebral volume loss which is age expected. Moderate low density in the periventricular  white matter likely related to small vessel disease. No mass lesion, hemorrhage, hydrocephalus, acute infarct, intra-axial, or extra-axial fluid collection. CT CERVICAL SPINE FINDINGS Spinal visualization through the bottom of T2. Prevertebral soft tissues are within normal limits. No apical pneumothorax. Advanced multilevel cervical spondylosis. Results in areas of significant central canal and neural foraminal narrowing bilaterally. Most severe at C4-5, C5-6. Skull base intact. Straightening of expected cervical lordosis. Maintenance of vertebral body height. Approximately C6 inferiorly are suboptimally evaluated secondary to overlying soft tissues. Facets are well-aligned. Coronal reformats demonstrate a normal C1-C2 articulation. IMPRESSION: 1.  No acute intracranial abnormality. 2. Advanced cervical spondylosis, without acute fracture or subluxation. Straightening of expected cervical lordosis could be positional, due to muscular spasm, or ligamentous injury. 3. Sinus disease. Electronically Signed   By: Jeronimo Greaves M.D.   On: 03/26/2015 21:16   I have personally reviewed and evaluated these images and lab results as part of my medical decision-making.   EKG Interpretation   Date/Time:  Tuesday March 27 2015 00:20:02 EST Ventricular Rate:  80 PR Interval:  153 QRS Duration: 89 QT Interval:  367 QTC Calculation: 423 R Axis:   -25 Text Interpretation:  Sinus rhythm Atrial premature complex Borderline  left axis deviation Nonspecific T abnormalities, inferior leads since last  tracing no significant change Confirmed by BELFI  MD, MELANIE ([id number]) on  03/27/2015 12:23:09 AM      MDM   Final diagnoses:  Fall    Patient with dementia presents with fall.  VSS, NAD.  Mild superficial abrasion to anterior-posterior parietal region.  GCS 15.  Denies any other complaints.  No other signs of trauma.  Will obtain CT head and cervical spine. Imaging shows no acute intracranial abnormality.  Spoke  with [facility] regarding patient's baseline.  Per facility, patient is slightly off baseline, may be more lethargic.  Recently started on Ativan.  Will obtain EKG, CXR, CMP, CBC, and UA to r/o infectious or alternative etiology.  Case has been discussed with and seen by Dr. Fredderick Phenix who agrees with the above plan.   CXR negative.  EKG shows NSR, no acute changes.  UA negative.  CBC shows hgb 11.6, WBC 6.0.  CMP shows Cr 1.25, baseline.  Evaluation does not show pathology requiring ongoing emergent intervention or admission. Pt is hemodynamically stable and mentating appropriately. Discussed findings/results and plan with patient/guardian, who agrees with plan. All questions answered. Return precautions discussed and outpatient follow up given.       Cheri Fowler, PA-C 03/27/15 1610  Rolan Bucco, MD 03/27/15 [phone]

## 2015-03-27 LAB — COMPREHENSIVE METABOLIC PANEL
ALT: 16 U/L — AB (ref 17–63)
ANION GAP: 9 (ref 5–15)
AST: 23 U/L (ref 15–41)
Albumin: 3.6 g/dL (ref 3.5–5.0)
Alkaline Phosphatase: 66 U/L (ref 38–126)
BUN: 17 mg/dL (ref 6–20)
CHLORIDE: 107 mmol/L (ref 101–111)
CO2: 24 mmol/L (ref 22–32)
Calcium: 8.9 mg/dL (ref 8.9–10.3)
Creatinine, Ser: 1.25 mg/dL — ABNORMAL HIGH (ref 0.61–1.24)
GFR, EST NON AFRICAN AMERICAN: 55 mL/min — AB (ref >60)
Glucose, Bld: 93 mg/dL (ref 65–99)
POTASSIUM: 4 mmol/L (ref 3.5–5.1)
SODIUM: 140 mmol/L (ref 135–145)
Total Bilirubin: 0.4 mg/dL (ref 0.3–1.2)
Total Protein: 6.5 g/dL (ref 6.5–8.1)

## 2015-03-27 NOTE — Discharge Instructions (Signed)
Fall Prevention in the Home  Falls can cause injuries and can affect people from all age groups. There are many simple things that you can do to make your home safe and to help prevent falls. WHAT CAN I DO ON THE OUTSIDE OF MY HOME?  Regularly repair the edges of walkways and driveways and fix any cracks.  Remove high doorway thresholds.  Trim any shrubbery on the main path into your home.  Use bright outdoor lighting.  Clear walkways of debris and clutter, including tools and rocks.  Regularly check that handrails are securely fastened and in good repair. Both sides of any steps should have handrails.  Install guardrails along the edges of any raised decks or porches.  Have leaves, snow, and ice cleared regularly.  Use sand or salt on walkways during winter months.  In the garage, clean up any spills right away, including grease or oil spills. WHAT CAN I DO IN THE BATHROOM?  Use night lights.  Install grab bars by the toilet and in the tub and shower. Do not use towel bars as grab bars.  Use non-skid mats or decals on the floor of the tub or shower.  If you need to sit down while you are in the shower, use a plastic, non-slip stool..  Keep the floor dry. Immediately clean up any water that spills on the floor.  Remove soap buildup in the tub or shower on a regular basis.  Attach bath mats securely with double-sided non-slip rug tape.  Remove throw rugs and other tripping hazards from the floor. WHAT CAN I DO IN THE BEDROOM?  Use night lights.  Make sure that a bedside light is easy to reach.  Do not use oversized bedding that drapes onto the floor.  Have a firm chair that has side arms to use for getting dressed.  Remove throw rugs and other tripping hazards from the floor. WHAT CAN I DO IN THE KITCHEN?   Clean up any spills right away.  Avoid walking on wet floors.  Place frequently used items in easy-to-reach places.  If you need to reach for something  above you, use a sturdy step stool that has a grab bar.  Keep electrical cables out of the way.  Do not use floor polish or wax that makes floors slippery. If you have to use wax, make sure that it is non-skid floor wax.  Remove throw rugs and other tripping hazards from the floor. WHAT CAN I DO IN THE STAIRWAYS?  Do not leave any items on the stairs.  Make sure that there are handrails on both sides of the stairs. Fix handrails that are broken or loose. Make sure that handrails are as long as the stairways.  Check any carpeting to make sure that it is firmly attached to the stairs. Fix any carpet that is loose or worn.  Avoid having throw rugs at the top or bottom of stairways, or secure the rugs with carpet tape to prevent them from moving.  Make sure that you have a light switch at the top of the stairs and the bottom of the stairs. If you do not have them, have them installed. WHAT ARE SOME OTHER FALL PREVENTION TIPS?  Wear closed-toe shoes that fit well and support your feet. Wear shoes that have rubber soles or low heels.  When you use a stepladder, make sure that it is completely opened and that the sides are firmly locked. Have someone hold the ladder while you   are using it. Do not climb a closed stepladder.  Add color or contrast paint or tape to grab bars and handrails in your home. Place contrasting color strips on the first and last steps.  Use mobility aids as needed, such as canes, walkers, scooters, and crutches.  Turn on lights if it is dark. Replace any light bulbs that burn out.  Set up furniture so that there are clear paths. Keep the furniture in the same spot.  Fix any uneven floor surfaces.  Choose a carpet design that does not hide the edge of steps of a stairway.  Be aware of any and all pets.  Review your medicines with your healthcare provider. Some medicines can cause dizziness or changes in blood pressure, which increase your risk of falling. Talk  with your health care provider about other ways that you can decrease your risk of falls. This may include working with a physical therapist or trainer to improve your strength, balance, and endurance.   This information is not intended to replace advice given to you by your health care provider. Make sure you discuss any questions you have with your health care provider.   Document Released: 01/24/2002 Document Revised: 06/20/2014 Document Reviewed: 03/10/2014 Elsevier Interactive Patient Education 2016 [employer]  

## 2015-03-27 NOTE — ED Notes (Signed)
[facility] contacted for transportation back to [facility]

## 2015-03-27 NOTE — ED Notes (Signed)
Report given to [facility]

## 2015-03-30 ENCOUNTER — Ambulatory Visit: Payer: Medicare Other | Admitting: Diagnostic Neuroimaging

## 2015-04-11 ENCOUNTER — Encounter: Payer: Self-pay | Admitting: *Deleted

## 2015-04-11 ENCOUNTER — Ambulatory Visit (INDEPENDENT_AMBULATORY_CARE_PROVIDER_SITE_OTHER): Payer: Medicare Other | Admitting: Diagnostic Neuroimaging

## 2015-04-11 VITALS — BP 128/77 | HR 93

## 2015-04-11 DIAGNOSIS — R269 Unspecified abnormalities of gait and mobility: Secondary | ICD-10-CM | POA: Diagnosis not present

## 2015-04-11 DIAGNOSIS — F03A0 Unspecified dementia, mild, without behavioral disturbance, psychotic disturbance, mood disturbance, and anxiety: Secondary | ICD-10-CM

## 2015-04-11 DIAGNOSIS — M4712 Other spondylosis with myelopathy, cervical region: Secondary | ICD-10-CM

## 2015-04-11 DIAGNOSIS — F0390 Unspecified dementia without behavioral disturbance: Secondary | ICD-10-CM | POA: Diagnosis not present

## 2015-04-11 NOTE — Progress Notes (Signed)
[facility]  PATIENT: Roberto Simmons DOB: [birth year]  REFERRING CLINICIAN: Tripp HISTORY FROM: patient and son REASON FOR VISIT: new consult    HISTORICAL  CHIEF COMPLAINT:  Chief Complaint  Patient presents with  . Frequent falls    rm 6, New pt, son Verlee Monte [facility]    HISTORY OF PRESENT ILLNESS:   75 year old right-handed male with hypertension, hyperkalemia, anxiety, here for evaluation of gait and balance difficulty and frequent falls. Patient is accompanied by his son for this visit. Patient is a resident of [facility].  2013 patient was walking normally. By 2014 he began to have some balance problems and started using a cane. In 2015 his balance and gait continued to decline and he started using a walker. By 2016 and this year 2017, he had significant problems with balance. He has had at least 12 falls in the past 3-6 months. He's had multiple emergency room visits. Is generally gait mobility has continued to decline.  Review of the chart was performed and I noticed diagnosis of dementia. Apparently this was diagnosed in [place] a few years ago. Patient's son is not sure that this is the correct diagnosis. Patient's son denies any significant memory loss or dementia problems. Patient's son has been more involved in patient's care over the past couple of years.   REVIEW OF SYSTEMS: Full 14 system review of systems performed and notable only for anxiety weakness insomnia feeling hot joint pain blurred vision incontinence.  ALLERGIES: No Known Allergies  HOME MEDICATIONS: Outpatient Prescriptions Prior to Visit  Medication Sig Dispense Refill  . allopurinol (ZYLOPRIM) 100 MG tablet Take 100 mg by mouth daily.     Marland Kitchen amLODipine (NORVASC) 2.5 MG tablet Take 2.5 mg by mouth daily.    Marland Kitchen aspirin 81 MG tablet Take 81 mg by mouth daily.    . carvedilol (COREG) 6.25 MG tablet Take 9.375 mg by mouth 2 (two) times daily with  a meal.     . finasteride (PROSCAR) 5 MG tablet Take 2.5 mg by mouth at bedtime.     Marland Kitchen LORazepam (ATIVAN) 0.5 MG tablet Take 0.5 mg by mouth daily as needed for anxiety.     . tamsulosin (FLOMAX) 0.4 MG CAPS capsule Take 0.4 mg by mouth at bedtime.    Marland Kitchen ibuprofen (ADVIL,MOTRIN) 600 MG tablet Take 600 mg by mouth 3 (three) times daily with meals as needed for moderate pain.    Marland Kitchen ibuprofen (ADVIL,MOTRIN) 200 MG tablet Take 200 mg by mouth 3 (three) times daily as needed for moderate pain.     No facility-administered medications prior to visit.    PAST MEDICAL HISTORY: Past Medical History  Diagnosis Date  . Dementia   . BPH (benign prostatic hypertrophy)   . Bipolar disorder (HCC)   . Hypertension     PAST SURGICAL HISTORY: Past Surgical History  Procedure Laterality Date  . Cataract extraction Bilateral 08/2014, 10/2014    FAMILY HISTORY: Family History  Problem Relation Age of Onset  . Family history unknown: Yes    SOCIAL HISTORY:  Social History   Social History  . Marital Status: Widowed    Spouse Name: N/A  . Number of Children: 5  . Years of Education: 12   Occupational History  .      retired   Social History Main Topics  . Smoking status: Former Games developer  . Smokeless tobacco: Not on file     Comment: quit > 40  yrs ago  . Alcohol Use: No  . Drug Use: No  . Sexual Activity: Not on file   Other Topics Concern  . Not on file   Social History Narrative   Lives at [facility]   Caffeine none     PHYSICAL EXAM   GENERAL EXAM/CONSTITUTIONAL: Vitals:  Filed Vitals:   04/11/15 1106  BP: 128/77  Pulse: 93     There is no weight on file to calculate BMI.  No exam data present  Patient is in no distress; well developed, nourished and groomed; During history taking and exam patient is sitting in a wheelchair, stooped and slumped forward, eyes closed. He is able to hear what I'm saying and does respond at times.  ANTERIOR KYPHOSIS OF  NECK  EYE OPENING APRAXIA  PAUCITY OF SPEECH; MILD DYSARTHRIA  CARDIOVASCULAR:  Examination of carotid arteries is normal; no carotid bruits  Regular rate and rhythm, no murmurs  Examination of peripheral vascular system by observation and palpation is normal  EYES:  Ophthalmoscopic exam of optic discs and posterior segments is normal; no papilledema or hemorrhages  MUSCULOSKELETAL:  Gait, strength, tone, movements noted in Neurologic exam below  NEUROLOGIC: MENTAL STATUS:  No flowsheet data found.  awake, alert, oriented to person, place and time --> "Apr 12, 2015; [place]" DOESN'T KNOW [place]  REGISTERS 3/3; RECALLS 1/3  DECR CONCENTRATION  DECR FLUENCY  fund of knowledge appropriate  CRANIAL NERVE:   2nd - no papilledema on fundoscopic exam --> PINPOINT PUPILS  2nd, 3rd, 4th, 6th - pupils equal and reactive to light, visual fields full to confrontation, extraocular muscles intact, no nystagmus; DECR UPGAZE  5th - facial sensation symmetric  7th - facial strength symmetric  8th - hearing intact  9th - palate elevates symmetrically, uvula midline  11th - shoulder shrug symmetric  12th - tongue protrusion midline  MOTOR:   normal bulk and tone, BLE 5 EXCEPT DELTOIDS 4; BLE (HF 3, DF 4)  SENSORY:   normal and symmetric to light touch, temperature, vibration; DECR VIB AT TOES (~5 SEC)  COORDINATION:   finger-nose-finger, fine finger movements  SLOW  REFLEXES:   deep tendon reflexes --> BUE 1; BLE TRACE  POSITIVE SNOUT, MYERSONS, ROOTING REFLEXES  GAIT/STATION:   BARELY ABLE TO STAND WITH 2 PERSON ASSISTANCE; CANNOT TAKE ANY STEPS; LEANS BACKWARDS    DIAGNOSTIC DATA (LABS, IMAGING, TESTING) - I reviewed patient records, labs, notes, testing and imaging myself where available.  Lab Results  Component Value Date   WBC 6.0 03/26/2015   HGB 11.6* 03/26/2015   HCT 34.3* 03/26/2015   MCV 92.5 03/26/2015   PLT 304 03/26/2015       Component Value Date/Time   NA 140 03/26/2015 2308   K 4.0 03/26/2015 2308   CL 107 03/26/2015 2308   CO2 24 03/26/2015 2308   GLUCOSE 93 03/26/2015 2308   BUN 17 03/26/2015 2308   CREATININE 1.25* 03/26/2015 2308   CALCIUM 8.9 03/26/2015 2308   PROT 6.5 03/26/2015 2308   ALBUMIN 3.6 03/26/2015 2308   AST 23 03/26/2015 2308   ALT 16* 03/26/2015 2308   ALKPHOS 66 03/26/2015 2308   BILITOT 0.4 03/26/2015 2308   GFRNONAA 55* 03/26/2015 2308   GFRAA >60 03/26/2015 2308   No results found for: CHOL, HDL, LDLCALC, LDLDIRECT, TRIG, CHOLHDL No results found for: FAOZ3Y No results found for: VITAMINB12 No results found for: TSH   03/26/15 CT head [I reviewed images myself and agree  with interpretation. Moderate atrophy and moderate chronic small vessel ischemic disease. -VRP]  - No acute intracranial abnormality.  03/26/15 CT cervical spine [I reviewed images myself and agree with interpretation. 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 Chronic C7 spinous process fracture noted as well, stable from Oct 2016. -VRP]  - Advanced cervical spondylosis, without acute fracture or subluxation. Straightening of expected cervical lordosis could be positional, due to muscular spasm, or ligamentous injury. - Sinus disease.  03/28/15 EKG [I reviewed images myself and agree with interpretation. -VRP]  - sinus rhythm; atrial premature complexes    ASSESSMENT AND PLAN  75 y.o. year old male here with progressive gait deterioration since 2014, signs and symptoms of neurodegenerative dementia, significant postural instability, upper and lower extremity weakness, frontal release signs and abnormal CT of head and cervical spine.   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 Lumbar radiculopathy, neuropathy, deconditioning also possible.   1. Mild dementia   2. Spondylosis, cervical, with myelopathy   3. Gait difficulty       PLAN: - considered MRI brain and cervical spine, but with advanced symptoms, dementia, functional decline, will favor palliative care approach first; then may consider further testing/treatment after goals of care and advanced care planning better defined  Orders Placed This Encounter  Procedures  . Amb Referral to Palliative Care   Return in about 3 months (around 07/09/2015).  I reviewed images, labs, notes, records myself. I summarized findings and reviewed with patient, for this condition (frequent falls, lower ext weakness, dementia, possible cervical myelopathy) requiring high complexity decision making.    Suanne Marker, MD 04/11/2015, 11:58 AM Certified in Neurology, Neurophysiology and Neuroimaging  [facility] [address], [address] [place], [place] [address] [phone]

## 2015-04-11 NOTE — Patient Instructions (Signed)
Thank you for coming to see [place] at [facility]. I hope we have been able to provide you high quality care today.  You may receive a patient satisfaction survey over the next few weeks. We would appreciate your feedback and comments so that we may continue to improve ourselves and the health of our patients.  - I will setup palliative care consult - may consider MRI brain and cervical spine in future - caution with balance, walking, safety and supervision   ~~~~~~~~~~~~~~~~~~~~~~~~~~~~~~~~~~~~~~~~~~~~~~~~~~~~~~~~~~~~~~~~~

## 2015-07-20 ENCOUNTER — Ambulatory Visit: Payer: Medicare Other | Admitting: Diagnostic Neuroimaging

## 2016-06-14 IMAGING — CT CT CERVICAL SPINE W/O CM
3 of 6 series · 10 of 33 positions shown, 12 images · non-contrast
Comparison: 03/15/2015 head CT. Head and cervical spine CT of
03/13/2014.

CLINICAL DATA: Fall.  Abrasion to top of head.  Hypertension.

EXAM:
CT HEAD WITHOUT CONTRAST
CT CERVICAL SPINE WITHOUT CONTRAST
TECHNIQUE: Multidetector CT imaging of the head and cervical spine was
performed following the standard protocol without intravenous
contrast. Multiplanar CT image reconstructions of the cervical spine
were also generated.

[Series 5: c-spine st · axial · 0.26mm/px · z∈[-304,-206]mm · 2 of 99 slices shown, 3 images]
[im 25/99  soft-tissue]
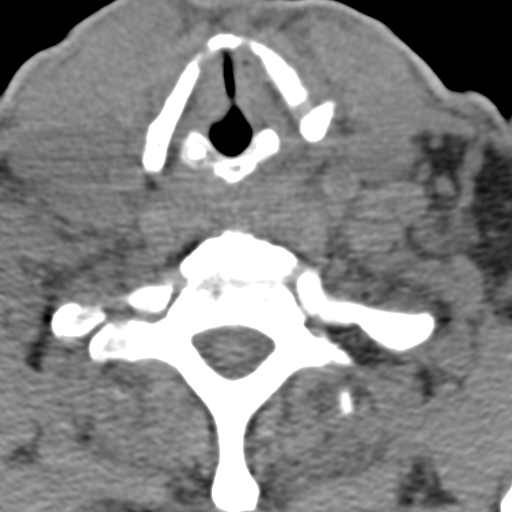
[im 25/99  bone]
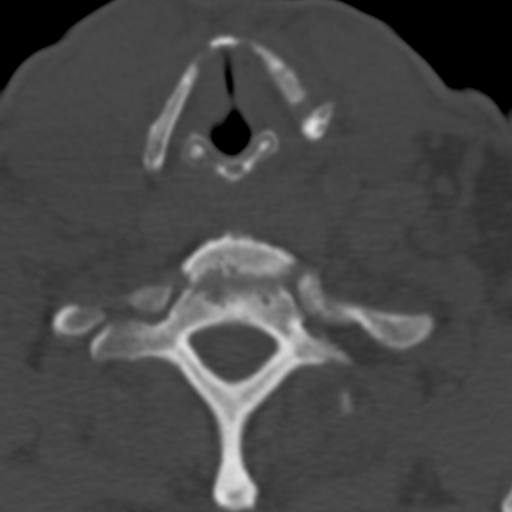
[im 74/99  bone]
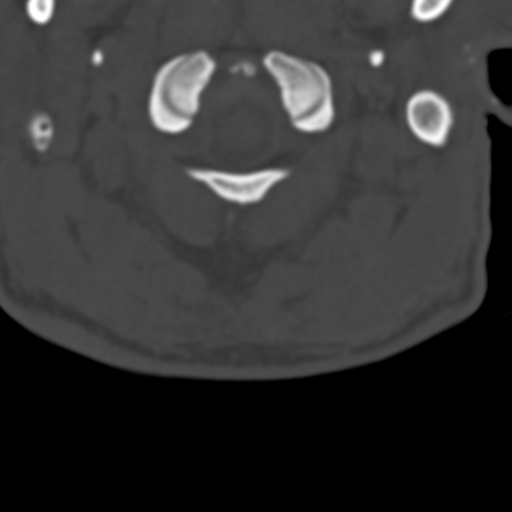

[Series 9: coronal recons · coronal · 0.29mm/px · 3 of 47 slices shown]
[im 10/47  bone]
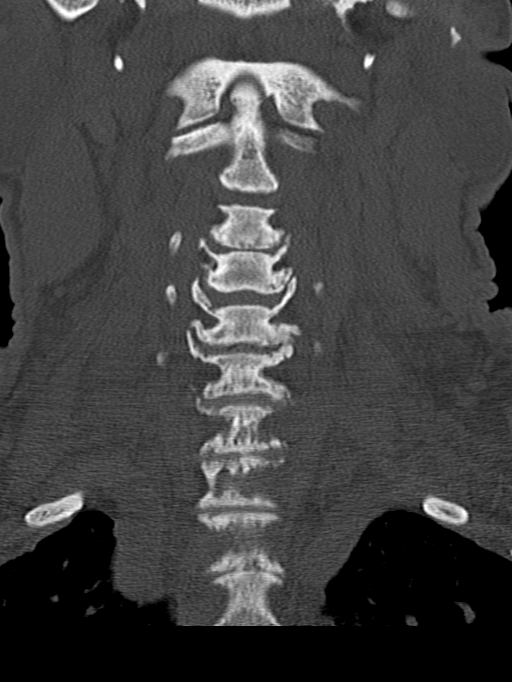
[im 19/47  bone]
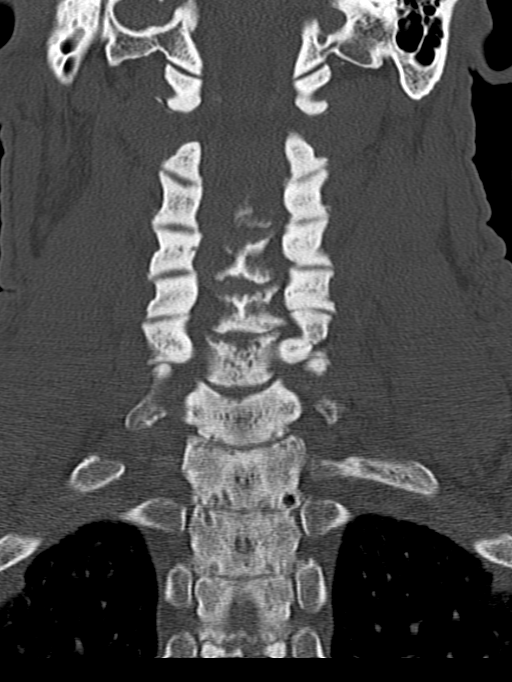
[im 28/47  bone]
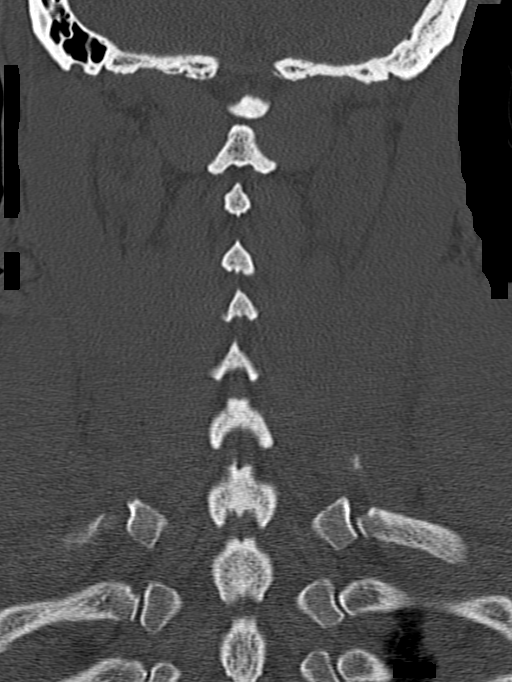

[Series 10: sagittal recons · sagittal · 0.20mm/px · 5 of 61 slices shown, 6 images]
[im 21/61  bone]
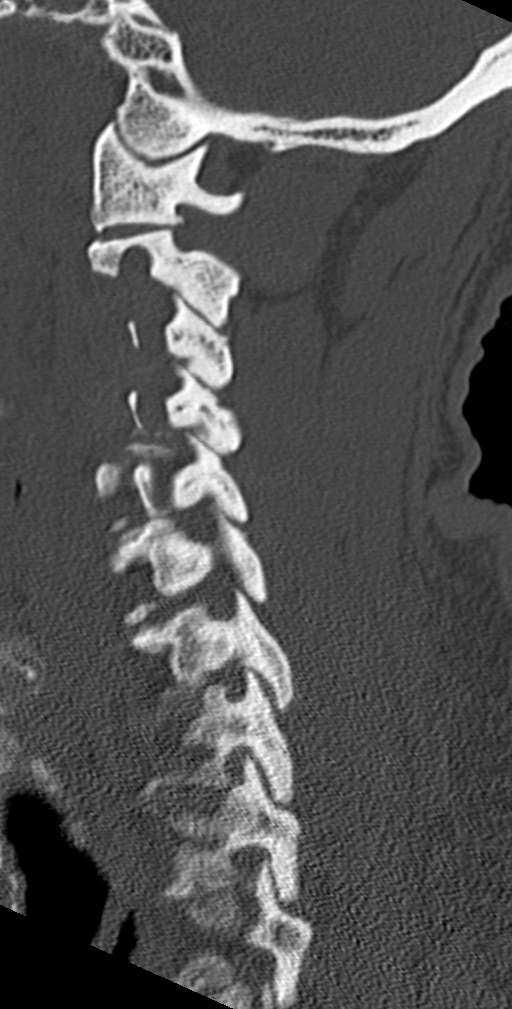
[im 26/61  bone]
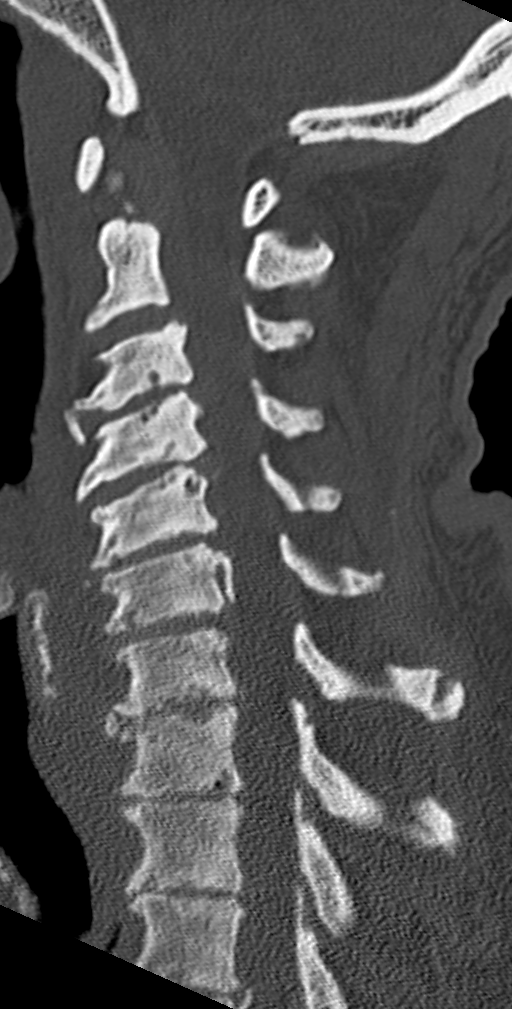
[im 31/61  soft-tissue]
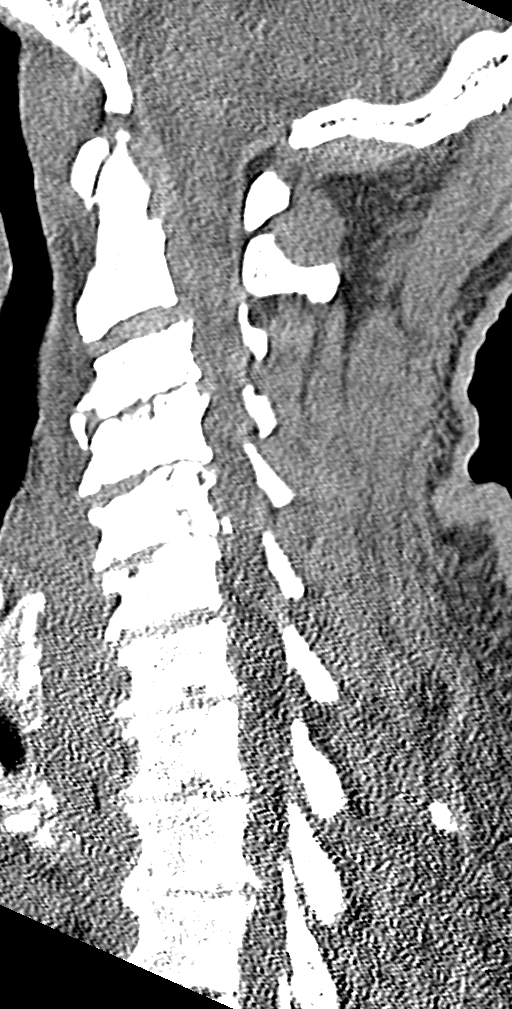
[im 31/61  bone]
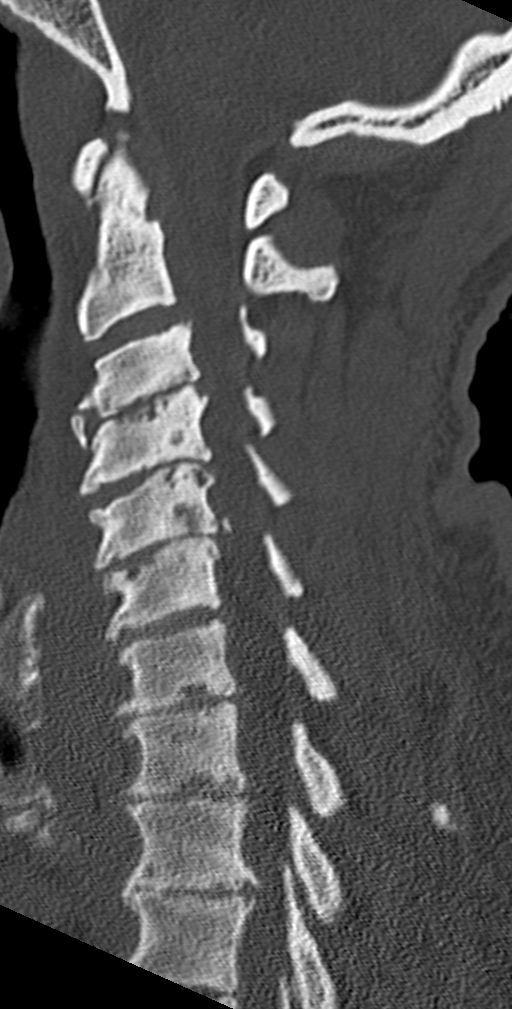
[im 36/61  bone]
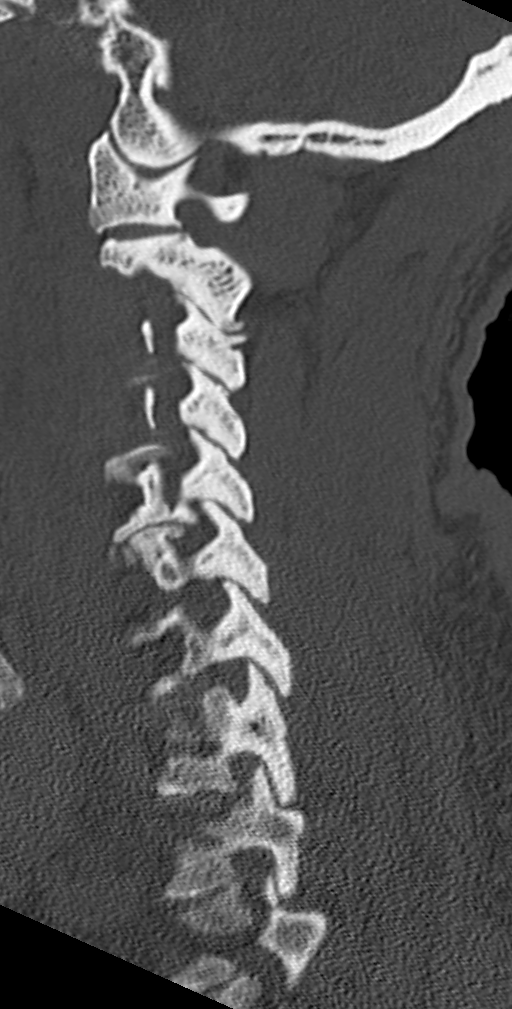
[im 41/61  bone]
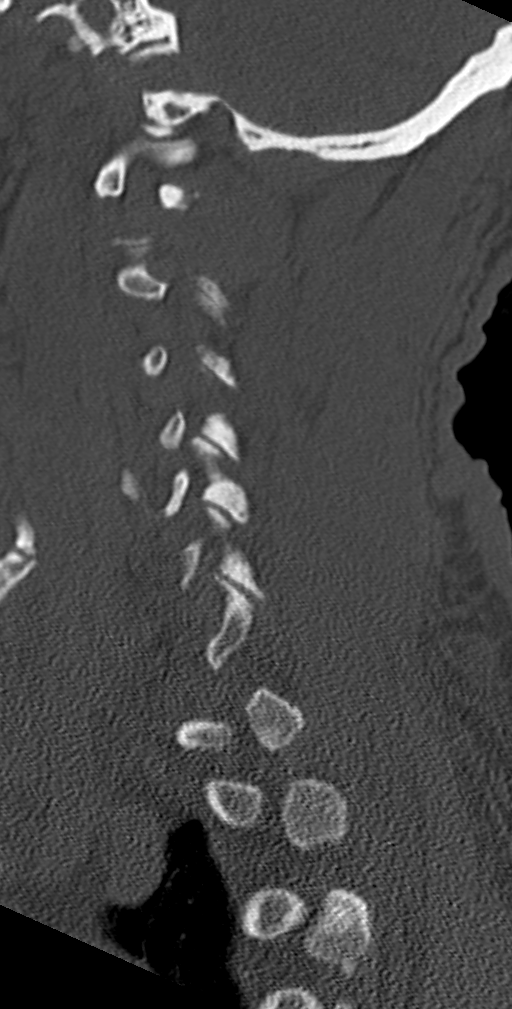

[10 of 33 positions shown; findings below may reference images not displayed]

FINDINGS: CT HEAD FINDINGS

Sinuses/Soft tissues: No significant soft tissue swelling. Mild
mucosal thickening of ethmoid air cells. Clear mastoid air cells.

Intracranial: Cerebral volume loss which is age expected. Moderate
low density in the periventricular white matter likely related to
small vessel disease. No mass lesion, hemorrhage, hydrocephalus,
acute infarct, intra-axial, or extra-axial fluid collection.

CT CERVICAL SPINE FINDINGS

Spinal visualization through the bottom of T2. Prevertebral soft
tissues are within normal limits. No apical pneumothorax. Advanced
multilevel cervical spondylosis. Results in areas of significant
central canal and neural foraminal narrowing bilaterally. Most
severe at C4-5, C5-6.

Skull base intact. Straightening of expected cervical lordosis.
Maintenance of vertebral body height. Approximately C6 inferiorly
are suboptimally evaluated secondary to overlying soft tissues.
Facets are well-aligned. Coronal reformats demonstrate a normal
C1-C2 articulation.
IMPRESSION: 1.  No acute intracranial abnormality.
2. Advanced cervical spondylosis, without acute fracture or
subluxation. Straightening of expected cervical lordosis could be
positional, due to muscular spasm, or ligamentous injury.
3. Sinus disease.

## 2018-03-20 DEATH — deceased
